# Patient Record
Sex: Male | Born: 1970 | Race: White | Hispanic: No | Smoking: Never smoker
Health system: Southern US, Community
[De-identification: ages and names within clinical notes are randomized; demographics above are authoritative.]

## PROBLEM LIST (undated history)

## (undated) DIAGNOSIS — E785 Hyperlipidemia, unspecified: Secondary | ICD-10-CM

## (undated) HISTORY — DX: Hyperlipidemia, unspecified: E78.5

## (undated) HISTORY — PX: VASECTOMY: SHX75

## (undated) HISTORY — PX: WISDOM TOOTH EXTRACTION: SHX21

## (undated) HISTORY — PX: CLEFT PALATE REPAIR: SUR1165

---

## 2004-04-24 ENCOUNTER — Ambulatory Visit: Payer: Self-pay | Admitting: Family Medicine

## 2005-04-11 ENCOUNTER — Ambulatory Visit: Payer: Self-pay | Admitting: Internal Medicine

## 2005-05-23 ENCOUNTER — Ambulatory Visit: Payer: Self-pay | Admitting: Family Medicine

## 2005-07-24 ENCOUNTER — Ambulatory Visit: Payer: Self-pay | Admitting: Family Medicine

## 2005-09-26 ENCOUNTER — Ambulatory Visit: Payer: Self-pay | Admitting: Family Medicine

## 2005-12-11 ENCOUNTER — Ambulatory Visit: Payer: Self-pay | Admitting: Family Medicine

## 2005-12-11 LAB — CONVERTED CEMR LAB
Chol/HDL Ratio, serum: 5.1
Triglyceride fasting, serum: 154 mg/dL — ABNORMAL HIGH (ref 0–149)
VLDL: 31 mg/dL (ref 0–40)

## 2006-10-15 ENCOUNTER — Encounter: Payer: Self-pay | Admitting: Family Medicine

## 2006-10-23 ENCOUNTER — Ambulatory Visit: Payer: Self-pay | Admitting: Family Medicine

## 2006-10-23 LAB — CONVERTED CEMR LAB
AST: 19 units/L (ref 0–37)
Bilirubin Urine: NEGATIVE
Bilirubin, Direct: 0.1 mg/dL (ref 0.0–0.3)
Chloride: 109 meq/L (ref 96–112)
Cholesterol: 171 mg/dL (ref 0–200)
Eosinophils Absolute: 0.2 10*3/uL (ref 0.0–0.6)
Eosinophils Relative: 4.8 % (ref 0.0–5.0)
GFR calc Af Amer: 108 mL/min
GFR calc non Af Amer: 89 mL/min
Glucose, Bld: 92 mg/dL (ref 70–99)
Glucose, Urine, Semiquant: NEGATIVE
HCT: 43.4 % (ref 39.0–52.0)
Hemoglobin: 14.9 g/dL (ref 13.0–17.0)
Lymphocytes Relative: 38.1 % (ref 12.0–46.0)
MCHC: 34.4 g/dL (ref 30.0–36.0)
MCV: 90.3 fL (ref 78.0–100.0)
Monocytes Absolute: 0.5 10*3/uL (ref 0.2–0.7)
Neutro Abs: 2.5 10*3/uL (ref 1.4–7.7)
Neutrophils Relative %: 46.5 % (ref 43.0–77.0)
Potassium: 4.1 meq/L (ref 3.5–5.1)
Sodium: 142 meq/L (ref 135–145)
Specific Gravity, Urine: 1.02
TSH: 2.64 microintl units/mL (ref 0.35–5.50)
WBC Urine, dipstick: NEGATIVE
WBC: 5.2 10*3/uL (ref 4.5–10.5)
pH: 6

## 2006-10-30 ENCOUNTER — Ambulatory Visit: Payer: Self-pay | Admitting: Family Medicine

## 2006-10-30 DIAGNOSIS — E785 Hyperlipidemia, unspecified: Secondary | ICD-10-CM | POA: Insufficient documentation

## 2007-10-29 ENCOUNTER — Ambulatory Visit: Payer: Self-pay | Admitting: Family Medicine

## 2007-10-29 LAB — CONVERTED CEMR LAB
Albumin: 3.9 g/dL (ref 3.5–5.2)
BUN: 11 mg/dL (ref 6–23)
Calcium: 9 mg/dL (ref 8.4–10.5)
Eosinophils Relative: 1.9 % (ref 0.0–5.0)
GFR calc Af Amer: 108 mL/min
Glucose, Bld: 93 mg/dL (ref 70–99)
Glucose, Urine, Semiquant: NEGATIVE
HCT: 43.9 % (ref 39.0–52.0)
Hemoglobin: 15.6 g/dL (ref 13.0–17.0)
Ketones, urine, test strip: NEGATIVE
Monocytes Absolute: 0.7 10*3/uL (ref 0.1–1.0)
Monocytes Relative: 9.2 % (ref 3.0–12.0)
Neutro Abs: 4.4 10*3/uL (ref 1.4–7.7)
Nitrite: NEGATIVE
Specific Gravity, Urine: 1.015
Total CHOL/HDL Ratio: 3.7
Total Protein: 7.2 g/dL (ref 6.0–8.3)
Triglycerides: 113 mg/dL (ref 0–149)
WBC Urine, dipstick: NEGATIVE
WBC: 7.1 10*3/uL (ref 4.5–10.5)

## 2007-11-07 ENCOUNTER — Ambulatory Visit: Payer: Self-pay | Admitting: Family Medicine

## 2007-11-07 DIAGNOSIS — M25519 Pain in unspecified shoulder: Secondary | ICD-10-CM | POA: Insufficient documentation

## 2007-11-18 ENCOUNTER — Ambulatory Visit: Payer: Self-pay | Admitting: Family Medicine

## 2007-11-19 LAB — CONVERTED CEMR LAB: PSA: 0.79 ng/mL (ref 0.10–4.00)

## 2009-04-28 ENCOUNTER — Ambulatory Visit: Payer: Self-pay | Admitting: Family Medicine

## 2009-04-28 LAB — CONVERTED CEMR LAB
ALT: 23 units/L (ref 0–53)
AST: 17 units/L (ref 0–37)
Albumin: 4.1 g/dL (ref 3.5–5.2)
Basophils Relative: 0.3 % (ref 0.0–3.0)
Eosinophils Relative: 2.3 % (ref 0.0–5.0)
GFR calc non Af Amer: 78.93 mL/min (ref >60)
Glucose, Bld: 92 mg/dL (ref 70–99)
Glucose, Urine, Semiquant: NEGATIVE
HCT: 45.3 % (ref 39.0–52.0)
Hemoglobin: 15.8 g/dL (ref 13.0–17.0)
Lymphs Abs: 2.1 10*3/uL (ref 0.7–4.0)
Monocytes Relative: 9.3 % (ref 3.0–12.0)
Neutro Abs: 3 10*3/uL (ref 1.4–7.7)
Potassium: 4.4 meq/L (ref 3.5–5.1)
RBC: 5.03 M/uL (ref 4.22–5.81)
RDW: 12.6 % (ref 11.5–14.6)
Sodium: 140 meq/L (ref 135–145)
Specific Gravity, Urine: 1.02
TSH: 2.37 microintl units/mL (ref 0.35–5.50)
Total Protein: 6.7 g/dL (ref 6.0–8.3)
VLDL: 32.4 mg/dL (ref 0.0–40.0)
WBC Urine, dipstick: NEGATIVE
WBC: 5.8 10*3/uL (ref 4.5–10.5)
pH: 6

## 2009-05-17 ENCOUNTER — Ambulatory Visit: Payer: Self-pay | Admitting: Family Medicine

## 2009-05-17 DIAGNOSIS — R0609 Other forms of dyspnea: Secondary | ICD-10-CM | POA: Insufficient documentation

## 2009-05-17 DIAGNOSIS — R0989 Other specified symptoms and signs involving the circulatory and respiratory systems: Secondary | ICD-10-CM | POA: Insufficient documentation

## 2009-06-02 ENCOUNTER — Ambulatory Visit: Payer: Self-pay | Admitting: Pulmonary Disease

## 2010-02-15 NOTE — Assessment & Plan Note (Signed)
Summary: consult for possible osa   Copy to:  Kelle Darting Primary Provider/Referring Provider:  Roderick Pee MD  CC:  Sleep Consult.  History of Present Illness: The pt is a 40y/o male who I have been asked to see for possible osa.  He has been snoring for years, but feels it is getting louder.  He has been noted to have pauses in his breathing during sleep, but denies choking arousals.  He has tried breathe-rite strips without improvement.  He typically goes to bed btw 11-12am, and arises at 7am to start his day.  He feels rested upon arising most days.  He works as an Pensions consultant, and denies any daytime sleepiness issues or abnormal alertness/concentration.  He does not doze with TV in the evening, and has no sleepiness with driving.  His weight is neutral over the last 2 years.  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  Medications Prior to Update: 1)  Zocor 80 Mg  Tabs (Simvastatin) .... Qhs 2)  Adult Aspirin Ec Low Strength 81 Mg Tbec (Aspirin) .... Once Daily  Allergies (verified): No Known Drug Allergies  Past History:  Past Medical History: Reviewed history from 10/30/2006 and no changes required. Hyperlipidemia surgery cleft lip and palate as a child  Past Surgical History: cleft palate and lip surgery at birth and also as a child wisdom teeth extracted  Family History: Reviewed history from 10/30/2006 and no changes required. allergies: mother,  heart disease: mother and father (hyperlipidemia)  cancer: father (prostate)  Family History High cholesterol  Social History: Reviewed history from 10/30/2006 and no changes required. Occupation: Clinical research associate Married with 2 daughters Never Smoked Alcohol use-yes Drug use-no Regular exercise-yes  Review of Systems       The patient complains of nasal congestion/difficulty breathing through nose.  The patient denies shortness of breath with activity, shortness of breath at rest,  productive cough, non-productive cough, coughing up blood, chest pain, irregular heartbeats, acid heartburn, indigestion, loss of appetite, weight change, abdominal pain, difficulty swallowing, sore throat, tooth/dental problems, headaches, sneezing, itching, ear ache, anxiety, depression, hand/feet swelling, joint stiffness or pain, rash, change in color of mucus, and fever.    Vital Signs:  Patient profile:   40 year old male Height:      72 inches Weight:      182 pounds BMI:     24.77 O2 Sat:      98 % on Room air Temp:     98.0 degrees F oral Pulse rate:   98 / minute BP sitting:   108 / 84  (right arm) Cuff size:   regular  Vitals Entered By: Arman Filter LPN (Jun 02, 2009 2:31 PM)  O2 Flow:  Room air CC: Sleep Consult Comments Medications reviewed with patient Arman Filter LPN  Jun 02, 2009 2:35 PM    Physical Exam  General:  thin male in nad Eyes:  PERRLA and EOMI.   Nose:  narrowed, but not obstructed Mouth:  mild narrowing posteriorly, but uvula and palate not overly abnormal Neck:  no jvd, tmg, LN Lungs:  totally clear to auscultation Heart:  rrr, no mrg Abdomen:  soft and nontender, bs+ Extremities:  no edema, pulses intact distally no cyanosis Neurologic:  alert and oriented, moves all 4.   Impression & Recommendations:  Problem # 1:  SNORING (ICD-786.09)  the pt's history is most suggestive of asymptomatic snoring.  His wife has witnessed a few  pauses, but he sleeps well and denies nonrestorative sleep.  He denies any concentration or alertness issues during the day with inactivity.  The pt is at an appropriate weight, and does not have a large neck size.  He does have a very narrow nasal airway, and some anatomical abnormalities probably related to prior surgeries (but no significant narrowing).  My suspicion for clinically signficant sleep apnea is very low.  I have told the pt that if his wife feels very strongly about ruling out this issue, can do  screening home study.  In terms of his snoring, I have outlined positional therapy, ENT evaluation with possible surgery, and dental appliance.  He will consider all of the above.  Other Orders: Consultation Level IV (14782)  Patient Instructions: 1)  let me know if worsening symptoms 2)  consider options for snoring, and let me know if I can be of assistance.

## 2010-02-15 NOTE — Assessment & Plan Note (Signed)
Summary: cpx//ccm   Vital Signs:  Patient profile:   40 year old male Height:      71 inches Weight:      182 pounds BMI:     25.48 Temp:     98.1 degrees F oral BP sitting:   110 / 78  (left arm) Cuff size:   regular  Vitals Entered By: Kern Reap CMA Duncan Dull) (May 17, 2009 3:54 PM) CC: cpx Is Patient Diabetic? No Pain Assessment Patient in pain? no        CC:  cpx.  History of Present Illness: Rudie is a 40 year old, married male, nonsmoker, who comes in today for physical evaluation because of underlying hyperlipidemia and a new problem of snoring and possible sleep apnea.  His hyperlipidemia is managed with Zocor 80 mg nightly also, one baby aspirin total cholesterol 167, LDL 94.  No side effects from medication.  For a long time now his wife has had issues with his snoring and she thinks he might have sleep apnea.  He did have surgery years ago for cleft lip and does have some narrowing of his upper airway.  Review of systems negative.  Tetanus booster 2008 seasonal flu 2010  Allergies: No Known Drug Allergies  Past History:  Past medical, surgical, family and social histories (including risk factors) reviewed, and no changes noted (except as noted below).  Past Medical History: Reviewed history from 10/30/2006 and no changes required. 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 Family History High cholesterol  Social History: Reviewed history from 10/30/2006 and no changes required. Occupation: Clinical research associate Married Never Smoked Alcohol use-yes Drug use-no Regular exercise-yes  Review of Systems      See HPI  Physical Exam  General:  Well-developed,well-nourished,in no acute distress; alert,appropriate and cooperative throughout examination Head:  Normocephalic and atraumatic without obvious abnormalities. No apparent alopecia or balding. Eyes:  No corneal or  conjunctival inflammation noted. EOMI. Perrla. Funduscopic exam benign, without hemorrhages, exudates or papilledema. Vision grossly normal. Ears:  External ear exam shows no significant lesions or deformities.  Otoscopic examination reveals clear canals, tympanic membranes are intact bilaterally without bulging, retraction, inflammation or discharge. Hearing is grossly normal bilaterally. Nose:  External nasal examination shows no deformity or inflammation. Nasal mucosa are pink and moist without lesions or exudates. Mouth:  oral cavity normal.  Slight deformity of upper lid from previous surgery as a child.  Also narrowing of the airway behind the tonsillar area. Neck:  No deformities, masses, or tenderness noted. Chest Wall:  No deformities, masses, tenderness or gynecomastia noted. Breasts:  No masses or gynecomastia noted Lungs:  Normal respiratory effort, chest expands symmetrically. Lungs are clear to auscultation, no crackles or wheezes. Heart:  Normal rate and regular rhythm. S1 and S2 normal without gallop, murmur, click, rub or other extra sounds. Abdomen:  Bowel sounds positive,abdomen soft and non-tender without masses, organomegaly or hernias noted. Genitalia:  Testes bilaterally descended without nodularity, tenderness or masses. No scrotal masses or lesions. No penis lesions or urethral discharge. Msk:  No deformity or scoliosis noted of thoracic or lumbar spine.   Pulses:  R and L carotid,radial,femoral,dorsalis pedis and posterior tibial pulses are full and equal bilaterally Extremities:  No clubbing, cyanosis, edema, or deformity noted with normal full range of motion of all joints.   Neurologic:  No cranial nerve deficits noted. Station and gait are normal. Plantar reflexes are down-going bilaterally.  DTRs are symmetrical throughout. Sensory, motor and coordinative functions appear intact. Skin:  Intact without suspicious lesions or rashes Cervical Nodes:  No lymphadenopathy  noted Axillary Nodes:  No palpable lymphadenopathy Inguinal Nodes:  No significant adenopathy Psych:  Cognition and judgment appear intact. Alert and cooperative with normal attention span and concentration. No apparent delusions, illusions, hallucinations   Impression & Recommendations:  Problem # 1:  PHYSICAL EXAMINATION (ICD-V70.0) Assessment Unchanged  Orders: EKG w/ Interpretation (93000)  Problem # 2:  HYPERLIPIDEMIA (ICD-272.4) Assessment: Improved  His updated medication list for this problem includes:    Zocor 80 Mg Tabs (Simvastatin) ..... Qhs  Orders: EKG w/ Interpretation (93000)  Problem # 3:  OTHER DYSPNEA AND RESPIRATORY ABNORMALITIES (ICD-786.09) Assessment: New  Orders: Pulmonary Referral (Pulmonary)  Complete Medication List: 1)  Zocor 80 Mg Tabs (Simvastatin) .... Qhs 2)  Adult Aspirin Ec Low Strength 81 Mg Tbec (Aspirin) .... Once daily  Patient Instructions: 1)  I will put in a call to [facility] who does the sleep apnea evaluation.  2)  Please schedule a follow-up appointment in 1 year. Prescriptions: ZOCOR 80 MG  TABS (SIMVASTATIN) QHS  #100 x 3   Entered and Authorized by:   Roderick Pee MD   Signed by:   Roderick Pee MD on 05/17/2009   Method used:   Print then Give to Patient   RxID:   [id number]

## 2010-04-25 ENCOUNTER — Other Ambulatory Visit: Payer: Self-pay | Admitting: Family Medicine

## 2010-10-22 ENCOUNTER — Other Ambulatory Visit: Payer: Self-pay | Admitting: Family Medicine

## 2010-12-18 ENCOUNTER — Ambulatory Visit (INDEPENDENT_AMBULATORY_CARE_PROVIDER_SITE_OTHER): Payer: Self-pay | Admitting: Family Medicine

## 2010-12-18 ENCOUNTER — Encounter: Payer: Self-pay | Admitting: Family Medicine

## 2010-12-18 VITALS — BP 110/78 | Temp 98.2°F | Ht 77.0 in | Wt 192.0 lb

## 2010-12-18 DIAGNOSIS — Z0000 Encounter for general adult medical examination without abnormal findings: Secondary | ICD-10-CM

## 2010-12-18 DIAGNOSIS — H900 Conductive hearing loss, bilateral: Secondary | ICD-10-CM | POA: Insufficient documentation

## 2010-12-18 NOTE — Patient Instructions (Signed)
Return prn 

## 2010-12-18 NOTE — Progress Notes (Signed)
  Subjective:    Patient ID: Dylan Kline, male    DOB: 03-03-1970, 40 y.o.   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 Who comes in today for evaluation of bilateral hearing loss.  Attempt and gradually over the past couple months.  No history of trauma.  Has had a history of cerumen impactions   Review of Systems    General ENT review of systems otherwise negative Objective:   Physical Exam  Well-developed well-nourished man in no acute distress.  Examination of both ears shows bilateral cerumen impactions.  The right ear was flushed with warm water.  The left ear was suctioned by me      Assessment & Plan:  Bilateral hearing loss, secondary to her cerumen impactions cerumen impactions removed

## 2011-01-21 ENCOUNTER — Other Ambulatory Visit: Payer: Self-pay | Admitting: Family Medicine

## 2011-02-02 ENCOUNTER — Other Ambulatory Visit: Payer: Self-pay

## 2011-02-22 ENCOUNTER — Encounter: Payer: Self-pay | Admitting: Family Medicine

## 2011-04-05 ENCOUNTER — Other Ambulatory Visit (INDEPENDENT_AMBULATORY_CARE_PROVIDER_SITE_OTHER): Payer: BC Managed Care – PPO

## 2011-04-05 DIAGNOSIS — Z0000 Encounter for general adult medical examination without abnormal findings: Secondary | ICD-10-CM

## 2011-04-05 LAB — CBC WITH DIFFERENTIAL/PLATELET
Basophils Absolute: 0 10*3/uL (ref 0.0–0.1)
Eosinophils Absolute: 0.2 10*3/uL (ref 0.0–0.7)
Hemoglobin: 15.2 g/dL (ref 13.0–17.0)
Lymphocytes Relative: 37.8 % (ref 12.0–46.0)
Monocytes Relative: 8.9 % (ref 3.0–12.0)
Neutro Abs: 2.6 10*3/uL (ref 1.4–7.7)
Neutrophils Relative %: 49.2 % (ref 43.0–77.0)
RBC: 4.94 Mil/uL (ref 4.22–5.81)
RDW: 12.3 % (ref 11.5–14.6)

## 2011-04-05 LAB — BASIC METABOLIC PANEL
Calcium: 8.6 mg/dL (ref 8.4–10.5)
Creatinine, Ser: 1.1 mg/dL (ref 0.4–1.5)
GFR: 82.48 mL/min (ref >60.00)
Glucose, Bld: 74 mg/dL (ref 70–99)
Sodium: 136 mEq/L (ref 135–145)

## 2011-04-05 LAB — LIPID PANEL
Cholesterol: 174 mg/dL (ref 0–200)
LDL Cholesterol: 105 mg/dL — ABNORMAL HIGH (ref 0–99)
Total CHOL/HDL Ratio: 4

## 2011-04-05 LAB — HEPATIC FUNCTION PANEL
ALT: 22 U/L (ref 0–53)
AST: 17 U/L (ref 0–37)
Bilirubin, Direct: 0 mg/dL (ref 0.0–0.3)
Total Bilirubin: 0.9 mg/dL (ref 0.3–1.2)

## 2011-04-05 LAB — POCT URINALYSIS DIPSTICK
Leukocytes, UA: NEGATIVE
Protein, UA: NEGATIVE
Urobilinogen, UA: 0.2

## 2011-04-12 ENCOUNTER — Encounter: Payer: Self-pay | Admitting: Family Medicine

## 2011-04-12 ENCOUNTER — Ambulatory Visit (INDEPENDENT_AMBULATORY_CARE_PROVIDER_SITE_OTHER): Payer: BC Managed Care – PPO | Admitting: Family Medicine

## 2011-04-12 VITALS — BP 110/78 | Temp 97.9°F | Ht 71.25 in | Wt 185.0 lb

## 2011-04-12 DIAGNOSIS — H900 Conductive hearing loss, bilateral: Secondary | ICD-10-CM

## 2011-04-12 DIAGNOSIS — M25519 Pain in unspecified shoulder: Secondary | ICD-10-CM

## 2011-04-12 DIAGNOSIS — E785 Hyperlipidemia, unspecified: Secondary | ICD-10-CM

## 2011-04-12 DIAGNOSIS — Z0000 Encounter for general adult medical examination without abnormal findings: Secondary | ICD-10-CM | POA: Insufficient documentation

## 2011-04-12 MED ORDER — ATORVASTATIN CALCIUM 40 MG PO TABS: 40.0000 mg | ORAL_TABLET | Freq: Every day | ORAL | Status: DC

## 2011-04-12 NOTE — Patient Instructions (Signed)
Continue your good health habits return in one year sooner if any problems 

## 2011-04-12 NOTE — Progress Notes (Signed)
  Subjective:    Patient ID: Dylan Kline, male    DOB: August 06, 1970, 41 y.o.   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 At one time he saw Dr. Rayburn Ma the orthopedist for his shortness in his right shoulder. He is right-handed and used to play softball a lot. Tetanus 2008  Review of systems negative  Family history unchanged father developed prostate cancer when he was in his late 36s. We discussed the concept of prostate cancer screening pros and cons and based on his family history I would recommend we not start any PSA screening until age 68 if he is asymptomatic and he has a normal exam   Review of Systems  Constitutional: Negative.   HENT: Negative.   Eyes: Negative.   Respiratory: Negative.   Cardiovascular: Negative.   Gastrointestinal: Negative.   Genitourinary: Negative.   Musculoskeletal: Negative.   Skin: Negative.   Neurological: Negative.   Hematological: Negative.   Psychiatric/Behavioral: Negative.        Objective:   Physical Exam  Constitutional: He is oriented to person, place, and time. He appears well-developed and well-nourished.  HENT:  Head: Normocephalic and atraumatic.  Right Ear: External ear normal.  Left Ear: External ear normal.  Nose: Nose normal.  Mouth/Throat: Oropharynx is clear and moist.  Eyes: Conjunctivae and EOM are normal. Pupils are equal, round, and reactive to light.  Neck: Normal range of motion. Neck supple. No JVD present. No tracheal deviation present. No thyromegaly present.  Cardiovascular: Normal rate, regular rhythm, normal heart sounds and intact distal pulses.  Exam reveals no gallop and no friction rub.   No murmur heard. Pulmonary/Chest: Effort normal and breath sounds normal. No stridor. No respiratory distress. He has no wheezes. He  has no rales. He exhibits no tenderness.  Abdominal: Soft. Bowel sounds are normal. He exhibits no distension and no mass. There is no tenderness. There is no rebound and no guarding.  Genitourinary: Rectum normal, prostate normal and penis normal. Guaiac negative stool. No penile tenderness.  Musculoskeletal: Normal range of motion. He exhibits no edema and no tenderness.  Lymphadenopathy:    He has no cervical adenopathy.  Neurological: He is alert and oriented to person, place, and time. He has normal reflexes. No cranial nerve deficit. He exhibits normal muscle tone.  Skin: Skin is warm and dry. No rash noted. No erythema. No pallor.  Psychiatric: He has a normal mood and affect. His behavior is normal. Judgment and thought content normal.          Assessment & Plan:  Healthy male  Rotator cuff injury right shoulder recommend Motrin 600 mg twice a day range of motion exercises  History of cleft palate repair as a child

## 2011-05-05 ENCOUNTER — Other Ambulatory Visit: Payer: Self-pay | Admitting: Family Medicine

## 2012-06-23 ENCOUNTER — Encounter: Payer: Self-pay | Admitting: Family Medicine

## 2012-06-23 ENCOUNTER — Ambulatory Visit (INDEPENDENT_AMBULATORY_CARE_PROVIDER_SITE_OTHER): Payer: BC Managed Care – PPO | Admitting: Family Medicine

## 2012-06-23 ENCOUNTER — Encounter: Payer: BC Managed Care – PPO | Admitting: Family Medicine

## 2012-06-23 ENCOUNTER — Telehealth: Payer: Self-pay | Admitting: Family Medicine

## 2012-06-23 VITALS — BP 110/80

## 2012-06-23 DIAGNOSIS — L255 Unspecified contact dermatitis due to plants, except food: Secondary | ICD-10-CM

## 2012-06-23 DIAGNOSIS — L247 Irritant contact dermatitis due to plants, except food: Secondary | ICD-10-CM

## 2012-06-23 MED ORDER — PREDNISONE 20 MG PO TABS: ORAL_TABLET | ORAL | Status: DC

## 2012-06-23 NOTE — Progress Notes (Signed)
  Subjective:    Patient ID: Dylan Kline, male    DOB: Mar 02, 1970, 42 y.o.   MRN: [id number]  HPI Dylan Kline is a 42 year old male who comes in today for evaluation of pain in his scalp  He noticed the other day some discomfort in the scalp now spread to the top of the scalp. He describes it as painful  No history of previous symptoms  He has been doing a lot of yard work   Review of Systems Review of systems otherwise negative     Objective:   Physical Exam  Well-developed and nourished male no acute distress examination scalp shows vesicular lesions and red raised areas consistent with a contact type dermatitis      Assessment & Plan:  Contact dermatitis of the scalp plan prednisone burst and taper

## 2012-06-23 NOTE — Patient Instructions (Signed)
Take the prednisone as directed,,,,,,,,, 2 now then starting tomorrow morning 2 tabs daily for 3 days and then taper  Return when necessary

## 2012-06-23 NOTE — Telephone Encounter (Signed)
Pt has painful rash/allergic reaction on on his scalp. Very painful. Pt request today appt. Another provider if Dr Tawanna Cooler not here. Pls advise.

## 2012-06-23 NOTE — Telephone Encounter (Signed)
Patient coming in today  

## 2012-06-23 NOTE — Progress Notes (Signed)
Error   This encounter was created in error - please disregard. 

## 2013-03-27 ENCOUNTER — Telehealth: Payer: Self-pay | Admitting: Family Medicine

## 2013-03-27 NOTE — Telephone Encounter (Signed)
Pt needs refill of simvastatin (ZOCOR) 80 MG tablet Pt has made cpe appt for 05/28/13. Can pt get enough to get through until a [facility]/ [address] [address]

## 2013-03-30 MED ORDER — SIMVASTATIN 80 MG PO TABS: ORAL_TABLET | ORAL | Status: DC

## 2013-03-30 NOTE — Telephone Encounter (Signed)
Pt notified Rx sent to pharmacy

## 2013-05-21 ENCOUNTER — Other Ambulatory Visit (INDEPENDENT_AMBULATORY_CARE_PROVIDER_SITE_OTHER): Payer: BC Managed Care – PPO

## 2013-05-21 DIAGNOSIS — Z0000 Encounter for general adult medical examination without abnormal findings: Secondary | ICD-10-CM

## 2013-05-21 LAB — BASIC METABOLIC PANEL
BUN: 15 mg/dL (ref 6–23)
CHLORIDE: 106 meq/L (ref 96–112)
CO2: 28 meq/L (ref 19–32)
CREATININE: 0.9 mg/dL (ref 0.4–1.5)
Calcium: 8.6 mg/dL (ref 8.4–10.5)
GFR: 92.78 mL/min (ref >60.00)
GLUCOSE: 84 mg/dL (ref 70–99)
POTASSIUM: 4.2 meq/L (ref 3.5–5.1)
Sodium: 139 mEq/L (ref 135–145)

## 2013-05-21 LAB — CBC WITH DIFFERENTIAL/PLATELET
Basophils Absolute: 0 10*3/uL (ref 0.0–0.1)
Basophils Relative: 0.6 % (ref 0.0–3.0)
EOS ABS: 0.1 10*3/uL (ref 0.0–0.7)
Eosinophils Relative: 2.9 % (ref 0.0–5.0)
HEMATOCRIT: 43.7 % (ref 39.0–52.0)
Hemoglobin: 15.1 g/dL (ref 13.0–17.0)
LYMPHS ABS: 2.1 10*3/uL (ref 0.7–4.0)
Lymphocytes Relative: 40.4 % (ref 12.0–46.0)
MCHC: 34.5 g/dL (ref 30.0–36.0)
MCV: 90.4 fl (ref 78.0–100.0)
MONO ABS: 0.4 10*3/uL (ref 0.1–1.0)
Monocytes Relative: 8 % (ref 3.0–12.0)
Neutro Abs: 2.5 10*3/uL (ref 1.4–7.7)
Neutrophils Relative %: 48.1 % (ref 43.0–77.0)
Platelets: 166 10*3/uL (ref 150.0–400.0)
RBC: 4.84 Mil/uL (ref 4.22–5.81)
RDW: 12.5 % (ref 11.5–15.5)
WBC: 5.1 10*3/uL (ref 4.0–10.5)

## 2013-05-21 LAB — TSH: TSH: 0.87 u[IU]/mL (ref 0.35–4.50)

## 2013-05-21 LAB — HEPATIC FUNCTION PANEL
ALT: 28 U/L (ref 0–53)
AST: 18 U/L (ref 0–37)
Albumin: 3.9 g/dL (ref 3.5–5.2)
Alkaline Phosphatase: 37 U/L — ABNORMAL LOW (ref 39–117)
BILIRUBIN TOTAL: 0.9 mg/dL (ref 0.2–1.2)
Bilirubin, Direct: 0 mg/dL (ref 0.0–0.3)
Total Protein: 6.6 g/dL (ref 6.0–8.3)

## 2013-05-21 LAB — POCT URINALYSIS DIPSTICK
Bilirubin, UA: NEGATIVE
GLUCOSE UA: NEGATIVE
Ketones, UA: NEGATIVE
Leukocytes, UA: NEGATIVE
Nitrite, UA: NEGATIVE
Protein, UA: NEGATIVE
RBC UA: NEGATIVE
Spec Grav, UA: 1.01
Urobilinogen, UA: 0.2
pH, UA: 6.5

## 2013-05-21 LAB — LIPID PANEL
CHOLESTEROL: 198 mg/dL (ref 0–200)
HDL: 44.2 mg/dL (ref >39.00)
LDL Cholesterol: 124 mg/dL — ABNORMAL HIGH (ref 0–99)
Total CHOL/HDL Ratio: 4
Triglycerides: 149 mg/dL (ref 0.0–149.0)
VLDL: 29.8 mg/dL (ref 0.0–40.0)

## 2013-05-28 ENCOUNTER — Ambulatory Visit (INDEPENDENT_AMBULATORY_CARE_PROVIDER_SITE_OTHER): Payer: BC Managed Care – PPO | Admitting: Family Medicine

## 2013-05-28 ENCOUNTER — Encounter: Payer: Self-pay | Admitting: Family Medicine

## 2013-05-28 VITALS — BP 120/84 | Temp 98.2°F | Ht 71.0 in | Wt 188.0 lb

## 2013-05-28 DIAGNOSIS — E785 Hyperlipidemia, unspecified: Secondary | ICD-10-CM

## 2013-05-28 DIAGNOSIS — Z0000 Encounter for general adult medical examination without abnormal findings: Secondary | ICD-10-CM

## 2013-05-28 DIAGNOSIS — Z8042 Family history of malignant neoplasm of prostate: Secondary | ICD-10-CM | POA: Insufficient documentation

## 2013-05-28 LAB — PSA: PSA: 0.75 ng/mL (ref 0.10–4.00)

## 2013-05-28 MED ORDER — SIMVASTATIN 80 MG PO TABS: ORAL_TABLET | ORAL | Status: DC

## 2013-05-28 NOTE — Progress Notes (Signed)
Pre visit review using our clinic review tool, if applicable. No additional management support is needed unless otherwise documented below in the visit note. 

## 2013-05-28 NOTE — Progress Notes (Signed)
   Subjective:    Patient ID: Dylan Kline, male    DOB: February 16, 1970, 43 y.o.   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 He is having some difficulty with his vision at night. Referred to Dr. Bing Plume  He gets regular dental care. Colonoscopy at 58 no family history colon cancer or polyps  Vaccinations up-to-date about Apolonio Schneiders  Father had prostate cancer in his late 56s   Review of Systems  Constitutional: Negative.   HENT: Negative.   Eyes: Negative.   Respiratory: Negative.   Cardiovascular: Negative.   Gastrointestinal: Negative.   Genitourinary: Negative.   Musculoskeletal: Negative.   Skin: Negative.   Neurological: Negative.   Psychiatric/Behavioral: Negative.        Objective:   Physical Exam  Nursing note and vitals reviewed. Constitutional: He is oriented to person, place, and time. He appears well-developed and well-nourished.  HENT:  Head: Normocephalic and atraumatic.  Right Ear: External ear normal.  Left Ear: External ear normal.  Nose: Nose normal.  Mouth/Throat: Oropharynx is clear and moist.  Eyes: Conjunctivae and EOM are normal. Pupils are equal, round, and reactive to light.  Neck: Normal range of motion. Neck supple. No JVD present. No tracheal deviation present. No thyromegaly present.  Cardiovascular: Normal rate, regular rhythm, normal heart sounds and intact distal pulses.  Exam reveals no gallop and no friction rub.   No murmur heard. Pulmonary/Chest: Effort normal and breath sounds normal. No stridor. No respiratory distress. He has no wheezes. He has no rales. He exhibits no tenderness.  Abdominal: Soft. Bowel sounds are normal. He exhibits no distension and no mass. There is no tenderness. There  is no rebound and no guarding.  Genitourinary: Rectum normal, prostate normal and penis normal. Guaiac negative stool. No penile tenderness.  Musculoskeletal: Normal range of motion. He exhibits no edema and no tenderness.  Lymphadenopathy:    He has no cervical adenopathy.  Neurological: He is alert and oriented to person, place, and time. He has normal reflexes. No cranial nerve deficit. He exhibits normal muscle tone.  Skin: Skin is warm and dry. No rash noted. No erythema. No pallor.  Psychiatric: He has a normal mood and affect. His behavior is normal. Judgment and thought content normal.   well healed surgery from cleft palate        Assessment & Plan:  Healthy male  Hyperlipidemia continue Lipitor  Family history prostate cancer check PSA

## 2013-05-28 NOTE — Patient Instructions (Signed)
Continue current medications  Followup in one year for general physical examination  PSA today

## 2014-03-11 ENCOUNTER — Ambulatory Visit (INDEPENDENT_AMBULATORY_CARE_PROVIDER_SITE_OTHER): Payer: BLUE CROSS/BLUE SHIELD | Admitting: Family Medicine

## 2014-03-11 VITALS — BP 120/80 | Temp 98.2°F | Wt 183.0 lb

## 2014-03-11 DIAGNOSIS — Z8042 Family history of malignant neoplasm of prostate: Secondary | ICD-10-CM

## 2014-03-11 DIAGNOSIS — J302 Other seasonal allergic rhinitis: Secondary | ICD-10-CM

## 2014-03-11 DIAGNOSIS — J309 Allergic rhinitis, unspecified: Secondary | ICD-10-CM | POA: Insufficient documentation

## 2014-03-11 NOTE — Progress Notes (Signed)
   Subjective:    Patient ID: Dylan Kline, male    DOB: [birth year], 44 y.o.   MRN: [id number]  HPI Dylan Kline is a 43 year old married male nonsmoker who comes in today for evaluation of a cough  He states he's had a cough intermittently off and on for many years. No fever no sputum production. He's a nonsmoker.  Environmentally they do have a cat   Review of Systems Review of systems otherwise negative    Objective:   Physical Exam  Well-developed well-nourished male no acute distress vital signs stable he is afebrile HEENT pertinent the has a scar in the posterior oropharynx from previous surgery had a cleft palate and cleft lip.  A lot of postnasal drip  Neck was normal lungs are clear      Assessment & Plan:  Allergic rhinitis........... discussed environmental triggers .......Marland Kitchen Zyrtec and/or steroid nasal spray daily at bedtime when necessary .....Marland KitchenMarland Kitchen

## 2014-03-11 NOTE — Progress Notes (Signed)
Pre visit review using our clinic review tool, if applicable. No additional management support is needed unless otherwise documented below in the visit note. 

## 2014-03-11 NOTE — Patient Instructions (Addendum)
Zyrtec plain......... one daily at bedtime  Steroid nasal spray........Marland Kitchen 1 shot up each nostril at bedtime if the Zyrtec alone does not control your symptoms  Return when necessary  Set up an appointment mid-May for your annual exam

## 2014-05-27 ENCOUNTER — Other Ambulatory Visit (INDEPENDENT_AMBULATORY_CARE_PROVIDER_SITE_OTHER): Payer: BLUE CROSS/BLUE SHIELD

## 2014-05-27 DIAGNOSIS — J302 Other seasonal allergic rhinitis: Secondary | ICD-10-CM | POA: Diagnosis not present

## 2014-05-27 DIAGNOSIS — Z8042 Family history of malignant neoplasm of prostate: Secondary | ICD-10-CM | POA: Diagnosis not present

## 2014-05-27 LAB — BASIC METABOLIC PANEL
BUN: 13 mg/dL (ref 6–23)
CALCIUM: 8.6 mg/dL (ref 8.4–10.5)
CHLORIDE: 106 meq/L (ref 96–112)
CO2: 28 mEq/L (ref 19–32)
CREATININE: 0.95 mg/dL (ref 0.40–1.50)
GFR: 91.22 mL/min (ref >60.00)
Glucose, Bld: 86 mg/dL (ref 70–99)
Potassium: 4.3 mEq/L (ref 3.5–5.1)
Sodium: 139 mEq/L (ref 135–145)

## 2014-05-27 LAB — HEPATIC FUNCTION PANEL
ALT: 25 U/L (ref 0–53)
AST: 15 U/L (ref 0–37)
Albumin: 3.8 g/dL (ref 3.5–5.2)
Alkaline Phosphatase: 41 U/L (ref 39–117)
Bilirubin, Direct: 0.1 mg/dL (ref 0.0–0.3)
TOTAL PROTEIN: 6.3 g/dL (ref 6.0–8.3)
Total Bilirubin: 0.4 mg/dL (ref 0.2–1.2)

## 2014-05-27 LAB — CBC WITH DIFFERENTIAL/PLATELET
BASOS ABS: 0 10*3/uL (ref 0.0–0.1)
Basophils Relative: 0.6 % (ref 0.0–3.0)
Eosinophils Absolute: 0.1 10*3/uL (ref 0.0–0.7)
Eosinophils Relative: 3.1 % (ref 0.0–5.0)
HCT: 42.8 % (ref 39.0–52.0)
HEMOGLOBIN: 14.9 g/dL (ref 13.0–17.0)
LYMPHS ABS: 1.9 10*3/uL (ref 0.7–4.0)
Lymphocytes Relative: 40.1 % (ref 12.0–46.0)
MCHC: 34.9 g/dL (ref 30.0–36.0)
MCV: 87.6 fl (ref 78.0–100.0)
MONO ABS: 0.5 10*3/uL (ref 0.1–1.0)
Monocytes Relative: 10.1 % (ref 3.0–12.0)
Neutro Abs: 2.2 10*3/uL (ref 1.4–7.7)
Neutrophils Relative %: 46.1 % (ref 43.0–77.0)
PLATELETS: 157 10*3/uL (ref 150.0–400.0)
RBC: 4.88 Mil/uL (ref 4.22–5.81)
RDW: 13 % (ref 11.5–15.5)
WBC: 4.8 10*3/uL (ref 4.0–10.5)

## 2014-05-27 LAB — POCT URINALYSIS DIPSTICK
Bilirubin, UA: NEGATIVE
Glucose, UA: NEGATIVE
Ketones, UA: NEGATIVE
Leukocytes, UA: NEGATIVE
Nitrite, UA: NEGATIVE
PROTEIN UA: NEGATIVE
RBC UA: NEGATIVE
SPEC GRAV UA: 1.02
UROBILINOGEN UA: 0.2
pH, UA: 6

## 2014-05-27 LAB — LIPID PANEL
Cholesterol: 173 mg/dL (ref 0–200)
HDL: 43.3 mg/dL (ref >39.00)
LDL Cholesterol: 106 mg/dL — ABNORMAL HIGH (ref 0–99)
NonHDL: 129.7
Total CHOL/HDL Ratio: 4
Triglycerides: 118 mg/dL (ref 0.0–149.0)
VLDL: 23.6 mg/dL (ref 0.0–40.0)

## 2014-05-27 LAB — PSA: PSA: 0.7 ng/mL (ref 0.10–4.00)

## 2014-05-27 LAB — TSH: TSH: 2.12 u[IU]/mL (ref 0.35–4.50)

## 2014-06-03 ENCOUNTER — Ambulatory Visit (INDEPENDENT_AMBULATORY_CARE_PROVIDER_SITE_OTHER): Payer: BLUE CROSS/BLUE SHIELD | Admitting: Family Medicine

## 2014-06-03 ENCOUNTER — Encounter: Payer: Self-pay | Admitting: Family Medicine

## 2014-06-03 VITALS — BP 120/84 | Temp 98.7°F | Ht 72.5 in | Wt 187.0 lb

## 2014-06-03 DIAGNOSIS — Z0000 Encounter for general adult medical examination without abnormal findings: Secondary | ICD-10-CM | POA: Diagnosis not present

## 2014-06-03 DIAGNOSIS — Z8042 Family history of malignant neoplasm of prostate: Secondary | ICD-10-CM

## 2014-06-03 DIAGNOSIS — E785 Hyperlipidemia, unspecified: Secondary | ICD-10-CM

## 2014-06-03 NOTE — Progress Notes (Signed)
Pre visit review using our clinic review tool, if applicable. No additional management support is needed unless otherwise documented below in the visit note. 

## 2014-06-03 NOTE — Progress Notes (Signed)
   Subjective:    Patient ID: Dylan Kline, male    DOB: 05/14/1970, 44 y.o.   MRN: [id number]  HPI Dylan Kline is a delightful 44 year old married male nonsmoker who comes in today for general physical examination because of a history of prostate cancer  Does not get routine eye care no history of any eye disease. Recommend screening eye exam every 3 years before age 65  Does get regular dental care walks sporadically.  Social history he is married lives here in [place] is a seventh grader and a ninth grader both girls at the [employer]. 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 Colonoscopy deferred a 49 no family history of colon cancer.   Review of Systems  Constitutional: Negative.   HENT: Negative.   Eyes: Negative.   Respiratory: Negative.   Cardiovascular: Negative.   Gastrointestinal: Negative.   Endocrine: Negative.   Genitourinary: Negative.   Musculoskeletal: Negative.   Skin: Negative.   Allergic/Immunologic: Negative.   Neurological: Negative.   Hematological: Negative.   Psychiatric/Behavioral: Negative.        Objective:   Physical Exam  Constitutional: He is oriented to person, place, and time. He appears well-developed and well-nourished.  HENT:  Head: Normocephalic and atraumatic.  Right Ear: External ear normal.  Left Ear: External ear normal.  Nose: Nose normal.  Mouth/Throat: Oropharynx is clear and moist.  Eyes: Conjunctivae and EOM are normal. Pupils are equal, round, and reactive to light.  Neck: Normal range of motion. Neck supple. No JVD present. No tracheal deviation present. No thyromegaly present.  Cardiovascular: Normal rate, regular rhythm, normal heart sounds and intact distal pulses.  Exam reveals no gallop and no friction rub.   No murmur heard. Pulmonary/Chest: Effort normal and breath sounds normal. No stridor. No respiratory distress. He has no wheezes. He has  no rales. He exhibits no tenderness.  Abdominal: Soft. Bowel sounds are normal. He exhibits no distension and no mass. There is no tenderness. There is no rebound and no guarding.  Genitourinary: Rectum normal, prostate normal and penis normal. Guaiac negative stool. No penile tenderness.  Musculoskeletal: Normal range of motion. He exhibits no edema or tenderness.  Lymphadenopathy:    He has no cervical adenopathy.  Neurological: He is alert and oriented to person, place, and time. He has normal reflexes. No cranial nerve deficit. He exhibits normal muscle tone.  Skin: Skin is warm and dry. No rash noted. No erythema. No pallor.  Psychiatric: He has a normal mood and affect. His behavior is normal. Judgment and thought content normal.  Nursing note and vitals reviewed.  Scars in the posterior pharynx from surgery related to cleft lip and palate       Assessment & Plan:  Healthy male  Family history of prostate cancer....... normal physical exam and PSA

## 2014-06-03 NOTE — Patient Instructions (Signed)
Walk 30 minutes daily  Return in one year for general physical exam sooner if any problems  Dr. Remonia Richter or Dr. Tora Duck

## 2015-04-18 DIAGNOSIS — Z3009 Encounter for other general counseling and advice on contraception: Secondary | ICD-10-CM | POA: Diagnosis not present

## 2015-06-17 DIAGNOSIS — Z302 Encounter for sterilization: Secondary | ICD-10-CM | POA: Diagnosis not present

## 2015-10-18 DIAGNOSIS — Z23 Encounter for immunization: Secondary | ICD-10-CM | POA: Diagnosis not present

## 2015-11-07 ENCOUNTER — Encounter (INDEPENDENT_AMBULATORY_CARE_PROVIDER_SITE_OTHER): Payer: Self-pay | Admitting: Orthopaedic Surgery

## 2015-11-07 ENCOUNTER — Other Ambulatory Visit (INDEPENDENT_AMBULATORY_CARE_PROVIDER_SITE_OTHER): Payer: Self-pay | Admitting: Orthopaedic Surgery

## 2015-11-07 ENCOUNTER — Ambulatory Visit (HOSPITAL_BASED_OUTPATIENT_CLINIC_OR_DEPARTMENT_OTHER)
Admission: RE | Admit: 2015-11-07 | Discharge: 2015-11-07 | Disposition: A | Discharge status: Home / Self Care | Payer: BLUE CROSS/BLUE SHIELD | Source: Ambulatory Visit | Attending: Orthopaedic Surgery | Admitting: Orthopaedic Surgery

## 2015-11-07 ENCOUNTER — Ambulatory Visit (INDEPENDENT_AMBULATORY_CARE_PROVIDER_SITE_OTHER): Payer: BLUE CROSS/BLUE SHIELD | Admitting: Orthopaedic Surgery

## 2015-11-07 DIAGNOSIS — M25532 Pain in left wrist: Secondary | ICD-10-CM | POA: Insufficient documentation

## 2015-11-07 NOTE — Progress Notes (Signed)
   Office Visit Note   Patient: Dylan Kline           Date of Birth: [birth year]           MRN: [id number] Visit Date: 11/07/2015              Requested by: Dorena Cookey, MD [place], [place] [id number] PCP: Joycelyn Man, MD   Assessment & Plan: Visit Diagnoses:  1. Pain in left wrist     Plan: NSAIDS, has own wrist splint, rest, follw-up prn  Follow-Up Instructions: Return if symptoms worsen or fail to improve.   Orders:  No orders of the defined types were placed in this encounter.  No orders of the defined types were placed in this encounter.     Procedures: No procedures performed   Clinical Data: No additional findings.   Subjective: Chief Complaint  Patient presents with  . Left Wrist - Injury    Pain from lifting luggage yesterday, now having painful radiation. Wearing velcro splint today. Denies any swelling.    HPI  Review of Systems   Objective: Vital Signs: There were no vitals taken for this visit.  Physical Exam  Constitutional: He is oriented to person, place, and time. He appears well-developed and well-nourished.  Musculoskeletal:       Left wrist: He exhibits tenderness.  Neurological: He is alert and oriented to person, place, and time.   Tenderness over the ECU tendon; no subluxating of the tendon.   Ortho Exam  Specialty Comments:  No specialty comments available.  Imaging: Dg Wrist Complete Left  Result Date: 11/07/2015 CLINICAL DATA:  Patient hurt his left wrist getting a heavy suitcase out of a SUV yesterday; pain is lateral left wrist/distal lateral forearm EXAM: LEFT WRIST - COMPLETE 3+ VIEW COMPARISON:  None. FINDINGS: There is no evidence of fracture or dislocation. There is no evidence of arthropathy or other focal bone abnormality. Soft tissues are unremarkable. IMPRESSION: Negative. Electronically Signed   By: Lucrezia Europe M.D.   On: 11/07/2015 09:12     PMFS History: Patient Active Problem  List   Diagnosis Date Noted  . Allergic rhinitis 03/11/2014  . Family history of prostate cancer 05/28/2013  . Contact dermatitis and eczema due to plant 06/23/2012  . Routine general medical examination at a health care facility 04/12/2011  . Hearing loss, conductive, bilateral 12/18/2010  . Other dyspnea and respiratory abnormality 05/17/2009  . SHOULDER PAIN, RIGHT 11/07/2007  . Hyperlipidemia 10/30/2006   Past Medical History:  Diagnosis Date  . Hyperlipidemia     Family History  Problem Relation Age of Onset  . Allergies Mother   . Hyperlipidemia Mother   . Heart disease Mother   . Hyperlipidemia Father   . Cancer Father     prostate    Past Surgical History:  Procedure Laterality Date  . CLEFT PALATE REPAIR    . WISDOM TOOTH EXTRACTION     Social History   Occupational History  . Not on file.   Social History Main Topics  . Smoking status: Never Smoker  . Smokeless tobacco: Never Used  . Alcohol use Yes  . Drug use: Unknown  . Sexual activity: Not on file

## 2016-09-30 DIAGNOSIS — Z23 Encounter for immunization: Secondary | ICD-10-CM | POA: Diagnosis not present

## 2016-12-03 ENCOUNTER — Ambulatory Visit (INDEPENDENT_AMBULATORY_CARE_PROVIDER_SITE_OTHER): Payer: BLUE CROSS/BLUE SHIELD | Admitting: Family Medicine

## 2016-12-03 ENCOUNTER — Encounter: Payer: Self-pay | Admitting: Family Medicine

## 2016-12-03 VITALS — BP 120/78 | HR 86 | Temp 98.4°F | Ht 72.0 in | Wt 189.0 lb

## 2016-12-03 DIAGNOSIS — Z8042 Family history of malignant neoplasm of prostate: Secondary | ICD-10-CM

## 2016-12-03 DIAGNOSIS — Z0000 Encounter for general adult medical examination without abnormal findings: Secondary | ICD-10-CM

## 2016-12-03 DIAGNOSIS — E7841 Elevated Lipoprotein(a): Secondary | ICD-10-CM

## 2016-12-03 DIAGNOSIS — Z23 Encounter for immunization: Secondary | ICD-10-CM

## 2016-12-03 LAB — CBC WITH DIFFERENTIAL/PLATELET
BASOS ABS: 0 10*3/uL (ref 0.0–0.1)
Basophils Relative: 0.8 % (ref 0.0–3.0)
Eosinophils Absolute: 0.1 10*3/uL (ref 0.0–0.7)
Eosinophils Relative: 1.8 % (ref 0.0–5.0)
HCT: 44.2 % (ref 39.0–52.0)
Hemoglobin: 15.1 g/dL (ref 13.0–17.0)
Lymphocytes Relative: 38.5 % (ref 12.0–46.0)
Lymphs Abs: 1.9 10*3/uL (ref 0.7–4.0)
MCHC: 34.3 g/dL (ref 30.0–36.0)
MCV: 89.6 fl (ref 78.0–100.0)
MONOS PCT: 8.4 % (ref 3.0–12.0)
Monocytes Absolute: 0.4 10*3/uL (ref 0.1–1.0)
NEUTROS ABS: 2.5 10*3/uL (ref 1.4–7.7)
NEUTROS PCT: 50.5 % (ref 43.0–77.0)
PLATELETS: 184 10*3/uL (ref 150.0–400.0)
RBC: 4.94 Mil/uL (ref 4.22–5.81)
RDW: 13.1 % (ref 11.5–15.5)
WBC: 4.9 10*3/uL (ref 4.0–10.5)

## 2016-12-03 LAB — BASIC METABOLIC PANEL
BUN: 13 mg/dL (ref 6–23)
CALCIUM: 9 mg/dL (ref 8.4–10.5)
CO2: 29 mEq/L (ref 19–32)
Chloride: 103 mEq/L (ref 96–112)
Creatinine, Ser: 0.82 mg/dL (ref 0.40–1.50)
GFR: 106.91 mL/min (ref >60.00)
GLUCOSE: 82 mg/dL (ref 70–99)
Potassium: 4.2 mEq/L (ref 3.5–5.1)
SODIUM: 139 meq/L (ref 135–145)

## 2016-12-03 LAB — LIPID PANEL
CHOL/HDL RATIO: 7
Cholesterol: 263 mg/dL — ABNORMAL HIGH (ref 0–200)
HDL: 39.8 mg/dL (ref >39.00)
NONHDL: 222.79
TRIGLYCERIDES: 227 mg/dL — AB (ref 0.0–149.0)
VLDL: 45.4 mg/dL — AB (ref 0.0–40.0)

## 2016-12-03 LAB — POCT URINALYSIS DIPSTICK
BILIRUBIN UA: NEGATIVE
Glucose, UA: NEGATIVE
KETONES UA: NEGATIVE
Leukocytes, UA: NEGATIVE
NITRITE UA: NEGATIVE
Protein, UA: NEGATIVE
RBC UA: NEGATIVE
Spec Grav, UA: 1.015 (ref 1.010–1.025)
Urobilinogen, UA: 0.2 E.U./dL
pH, UA: 7 (ref 5.0–8.0)

## 2016-12-03 LAB — TSH: TSH: 2.94 u[IU]/mL (ref 0.35–4.50)

## 2016-12-03 LAB — PSA: PSA: 0.76 ng/mL (ref 0.10–4.00)

## 2016-12-03 LAB — HEPATIC FUNCTION PANEL
ALK PHOS: 42 U/L (ref 39–117)
ALT: 17 U/L (ref 0–53)
AST: 16 U/L (ref 0–37)
Albumin: 4 g/dL (ref 3.5–5.2)
BILIRUBIN DIRECT: 0.1 mg/dL (ref 0.0–0.3)
BILIRUBIN TOTAL: 0.5 mg/dL (ref 0.2–1.2)
TOTAL PROTEIN: 6.5 g/dL (ref 6.0–8.3)

## 2016-12-03 LAB — LDL CHOLESTEROL, DIRECT: Direct LDL: 176 mg/dL

## 2016-12-03 NOTE — Progress Notes (Signed)
Dylan Kline is a 46 year old married male nonsmoker........ 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 who comes in today for general physical examination  He's always been in excellent health he said no chronic health problems. 1 point he had elevated lipids. We started him on lipid-lowering medication however with diet and exercise he stopped his Zocor. Last lipid panel 2 years ago was normal.  He gets routine eye care, dental care, colonoscopy not too age 35  Family history pertinent but his mother and father high cholesterol distended prostate cancer at age 43+. One brother in good health.  Vaccinations tetanus booster given today seasonal flu shot given at work.  BP 120/78 (BP Location: Left Arm, Patient Position: Sitting, Cuff Size: Normal)   Pulse 86   Temp 98.4 F (36.9 C) (Oral)   Ht 6' (1.829 m)   Wt 189 lb (85.7 kg)   BMI 25.63 kg/m  Is a well-developed well-nourished male no acute distress vital signs stable he is afebrile HEENT negative except for left cerumen impaction. Neck was supple thyroid not enlarged no carotid bruits cardiopulmonary exam normal abdominal exam normal genitalia normal circumcised male rectum normal stool guaiac-negative prostate normal extremities normal skin normal peripheral pulses normal except for chronic scar midline lower lip from previous oral surgery as a child.  #1 healthy male  #2 history of hyperlipidemia......... normal lipids on diet and exercise......Marland Kitchen recheck labs to be sure  #3 status post oral surgery........ childhood  #4 family history of prostate cancer father......... check PSA.

## 2016-12-03 NOTE — Patient Instructions (Signed)
Labs today.......... I will call you if anything is abnormal  Continue good diet exercise and health habits  Follow-up in one year sooner if any problems

## 2017-10-11 IMAGING — CR DG WRIST COMPLETE 3+V*L*
4 series · 4 of 4 positions shown · non-contrast
Comparison: None.

CLINICAL DATA: Patient hurt his left wrist getting a heavy suitcase
out of a SUV yesterday; pain is lateral left wrist/distal lateral
forearm

EXAM:
LEFT WRIST - COMPLETE 3+ VIEW

[x wrist pa left]
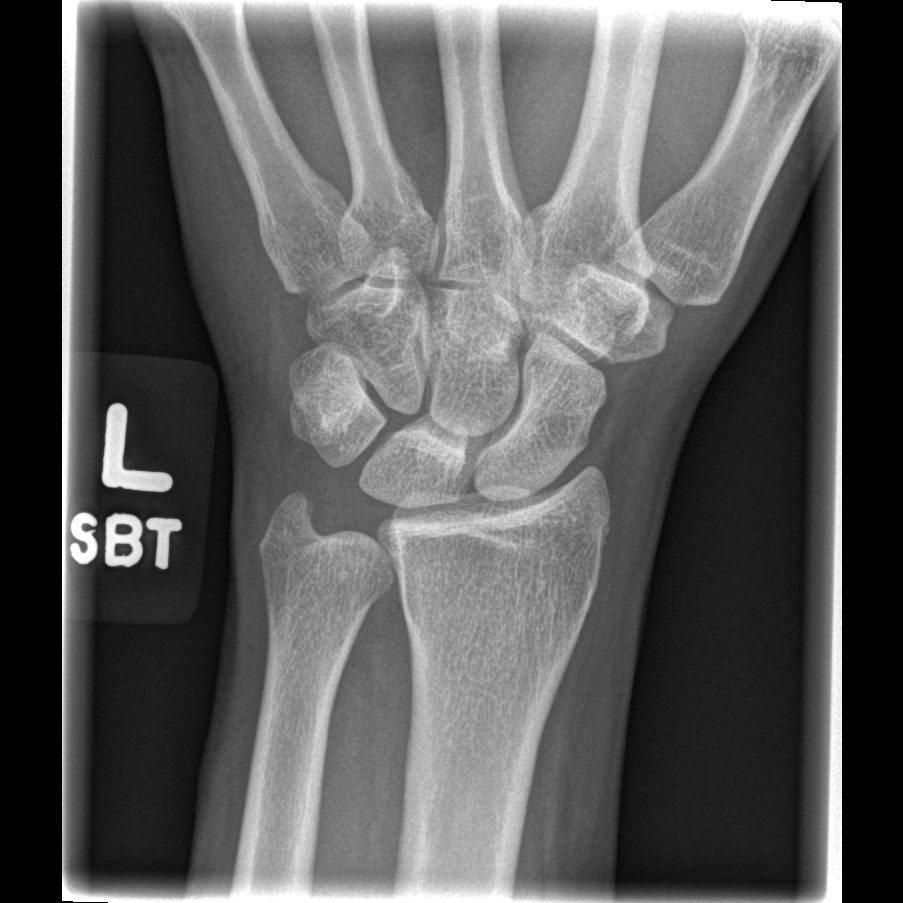

[x wrist obl left]
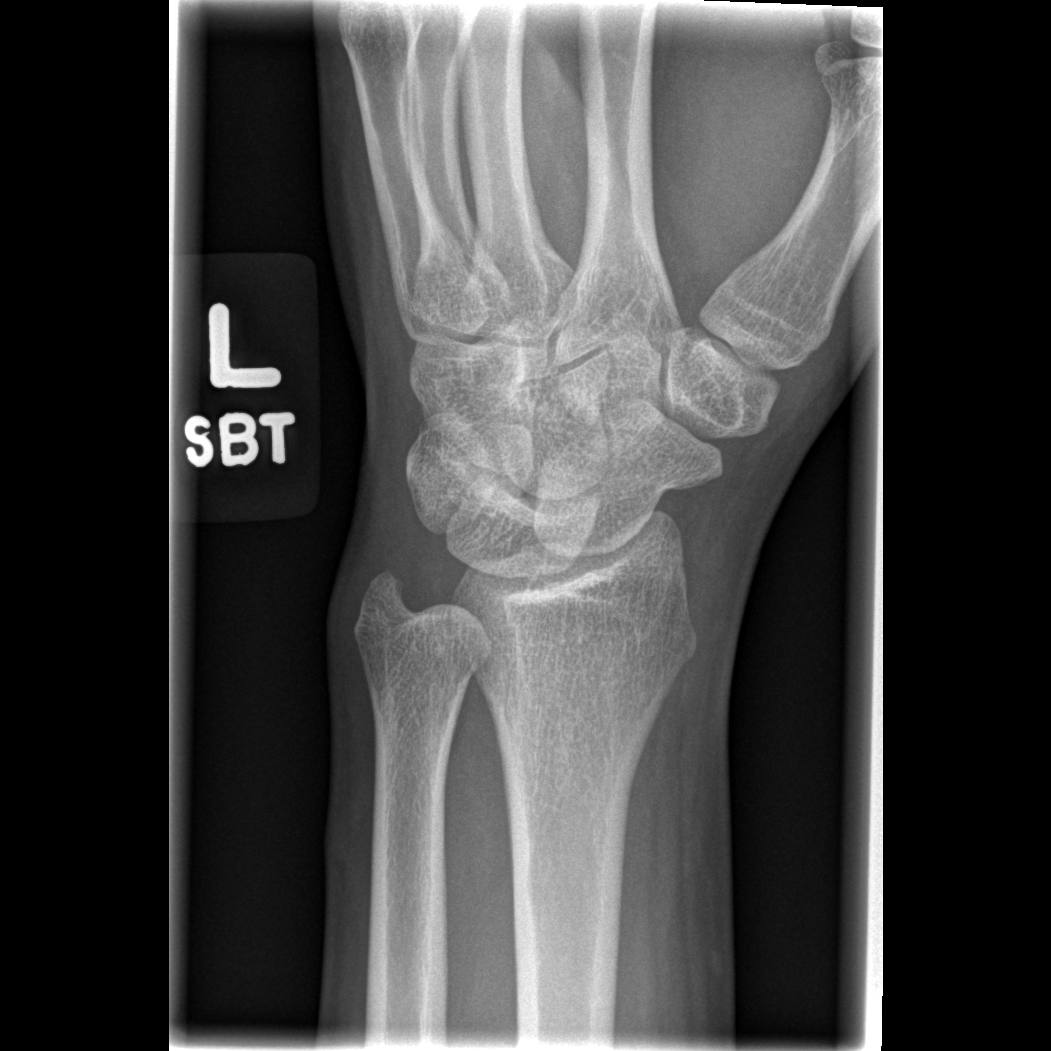

[x wrist lat left]
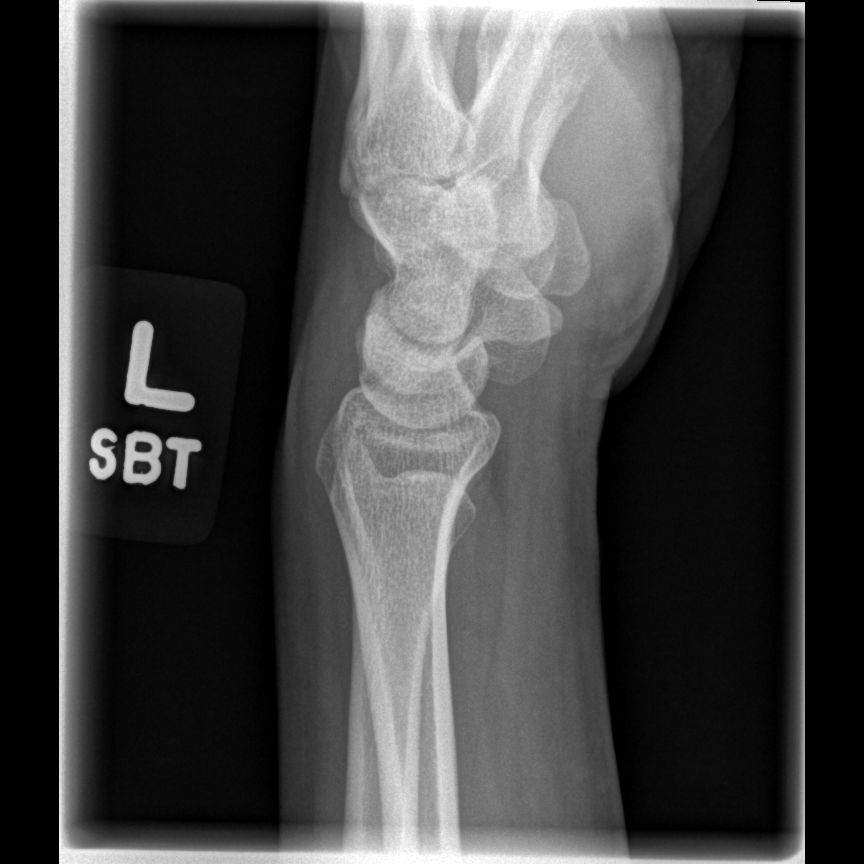

[x navicular]
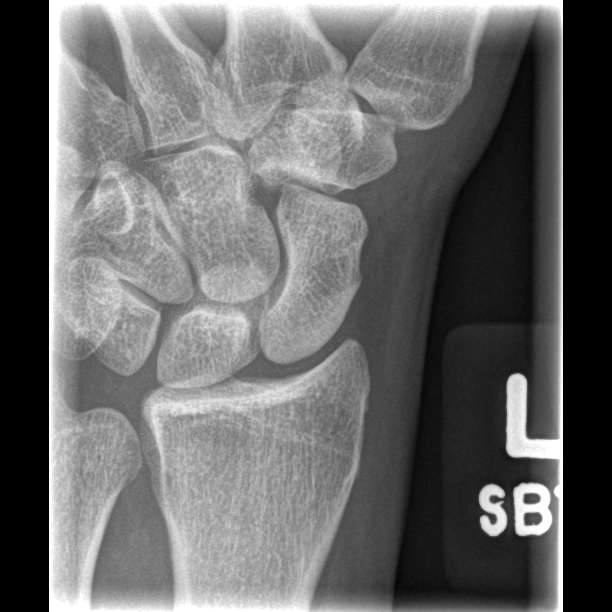

[4 of 4 positions shown; findings below may reference images not displayed]

FINDINGS: There is no evidence of fracture or dislocation. There is no
evidence of arthropathy or other focal bone abnormality. Soft
tissues are unremarkable.
IMPRESSION: Negative.

## 2018-08-13 DIAGNOSIS — H1130 Conjunctival hemorrhage, unspecified eye: Secondary | ICD-10-CM | POA: Diagnosis not present

## 2019-01-02 DIAGNOSIS — Z20828 Contact with and (suspected) exposure to other viral communicable diseases: Secondary | ICD-10-CM | POA: Diagnosis not present

## 2019-09-20 DIAGNOSIS — Z20822 Contact with and (suspected) exposure to covid-19: Secondary | ICD-10-CM | POA: Diagnosis not present

## 2019-09-24 DIAGNOSIS — Z20828 Contact with and (suspected) exposure to other viral communicable diseases: Secondary | ICD-10-CM | POA: Diagnosis not present

## 2020-06-28 ENCOUNTER — Ambulatory Visit (INDEPENDENT_AMBULATORY_CARE_PROVIDER_SITE_OTHER): Payer: BC Managed Care – PPO | Admitting: Internal Medicine

## 2020-06-28 ENCOUNTER — Other Ambulatory Visit: Payer: Self-pay

## 2020-06-28 ENCOUNTER — Encounter: Payer: Self-pay | Admitting: Internal Medicine

## 2020-06-28 VITALS — BP 120/82 | HR 85 | Temp 97.7°F | Ht 72.0 in | Wt 189.0 lb

## 2020-06-28 DIAGNOSIS — E7841 Elevated Lipoprotein(a): Secondary | ICD-10-CM | POA: Diagnosis not present

## 2020-06-28 DIAGNOSIS — Z0000 Encounter for general adult medical examination without abnormal findings: Secondary | ICD-10-CM

## 2020-06-28 DIAGNOSIS — R252 Cramp and spasm: Secondary | ICD-10-CM

## 2020-06-28 DIAGNOSIS — Z23 Encounter for immunization: Secondary | ICD-10-CM

## 2020-06-28 DIAGNOSIS — Z1211 Encounter for screening for malignant neoplasm of colon: Secondary | ICD-10-CM

## 2020-06-28 LAB — COMPREHENSIVE METABOLIC PANEL
ALT: 25 U/L (ref 0–53)
AST: 17 U/L (ref 0–37)
Albumin: 4.4 g/dL (ref 3.5–5.2)
Alkaline Phosphatase: 49 U/L (ref 39–117)
BUN: 13 mg/dL (ref 6–23)
CO2: 25 mEq/L (ref 19–32)
Calcium: 9 mg/dL (ref 8.4–10.5)
Chloride: 105 mEq/L (ref 96–112)
Creatinine, Ser: 0.91 mg/dL (ref 0.40–1.50)
GFR: 98.05 mL/min (ref >60.00)
Glucose, Bld: 94 mg/dL (ref 70–99)
Potassium: 4 mEq/L (ref 3.5–5.1)
Sodium: 138 mEq/L (ref 135–145)
Total Bilirubin: 0.6 mg/dL (ref 0.2–1.2)
Total Protein: 7.5 g/dL (ref 6.0–8.3)

## 2020-06-28 LAB — CBC WITH DIFFERENTIAL/PLATELET
Basophils Absolute: 0 10*3/uL (ref 0.0–0.1)
Basophils Relative: 0.7 % (ref 0.0–3.0)
Eosinophils Absolute: 0.1 10*3/uL (ref 0.0–0.7)
Eosinophils Relative: 1.4 % (ref 0.0–5.0)
HCT: 44 % (ref 39.0–52.0)
Hemoglobin: 15.2 g/dL (ref 13.0–17.0)
Lymphocytes Relative: 34.8 % (ref 12.0–46.0)
Lymphs Abs: 1.8 10*3/uL (ref 0.7–4.0)
MCHC: 34.6 g/dL (ref 30.0–36.0)
MCV: 88.6 fl (ref 78.0–100.0)
Monocytes Absolute: 0.4 10*3/uL (ref 0.1–1.0)
Monocytes Relative: 8.6 % (ref 3.0–12.0)
Neutro Abs: 2.8 10*3/uL (ref 1.4–7.7)
Neutrophils Relative %: 54.5 % (ref 43.0–77.0)
Platelets: 177 10*3/uL (ref 150.0–400.0)
RBC: 4.96 Mil/uL (ref 4.22–5.81)
RDW: 13 % (ref 11.5–15.5)
WBC: 5.1 10*3/uL (ref 4.0–10.5)

## 2020-06-28 LAB — LIPID PANEL
Cholesterol: 261 mg/dL — ABNORMAL HIGH (ref 0–200)
HDL: 44.7 mg/dL (ref >39.00)
LDL Cholesterol: 189 mg/dL — ABNORMAL HIGH (ref 0–99)
NonHDL: 216.74
Total CHOL/HDL Ratio: 6
Triglycerides: 140 mg/dL (ref 0.0–149.0)
VLDL: 28 mg/dL (ref 0.0–40.0)

## 2020-06-28 LAB — VITAMIN B12: Vitamin B-12: 221 pg/mL (ref 211–911)

## 2020-06-28 LAB — VITAMIN D 25 HYDROXY (VIT D DEFICIENCY, FRACTURES): VITD: 18.86 ng/mL — ABNORMAL LOW (ref 30.00–100.00)

## 2020-06-28 LAB — MAGNESIUM: Magnesium: 2.1 mg/dL (ref 1.5–2.5)

## 2020-06-28 LAB — TSH: TSH: 2.55 u[IU]/mL (ref 0.35–4.50)

## 2020-06-28 LAB — HEMOGLOBIN A1C: Hgb A1c MFr Bld: 5.5 % (ref 4.6–6.5)

## 2020-06-28 NOTE — Addendum Note (Signed)
Addended by: Amanda Cockayne on: 06/28/2020 11:35 AM   Modules accepted: Orders

## 2020-06-28 NOTE — Patient Instructions (Addendum)
-  Nice seeing you today!!  -Lab work today; will notify you once results are available.  -first shingles vaccine today.  -Schedule follow up in 1 year or sooner as needed.

## 2020-06-28 NOTE — Progress Notes (Signed)
New Patient Office Visit     This visit occurred during the SARS-CoV-2 public health emergency.  Safety protocols were in place, including screening questions prior to the visit, additional usage of staff PPE, and extensive cleaning of exam room while observing appropriate contact time as indicated for disinfecting solutions.    CC/Reason for Visit: Establish care, annual preventive exam Previous PCP: Dr. Stevie Kern Last Visit: 2018  HPI: Dylan Kline is a 50 y.o. male who is coming in today for the above mentioned reasons. Past Medical History is significant for: Hyperlipidemia who had been on simvastatin one point but was taken off of it after cholesterol normalized.  He has otherwise been healthy.  He has not had routine health care since 2018.  He has been having some foot cramps.  He has completed all 4 COVID vaccines, had a Tdap in 2018.  He is overdue for initial screening colonoscopy.  He is overdue for eye care, he has routine dental care.  He is a never smoker, drinks alcohol occasionally.  He has 2 daughters in college, is divorced, he is an Forensic psychologist.  His family history significant for father with prostate cancer and paternal grandfather with coronary artery disease.   Past Medical/Surgical History: Past Medical History:  Diagnosis Date   Hyperlipidemia     Past Surgical History:  Procedure Laterality Date   CLEFT PALATE REPAIR     WISDOM TOOTH EXTRACTION      Social History:  reports that he has never smoked. He has never used smokeless tobacco. He reports current alcohol use. No history on file for drug use.  Allergies: No Known Allergies  Family History:  Family History  Problem Relation Age of Onset   Allergies Mother    Hyperlipidemia Mother    Heart disease Mother    Hyperlipidemia Father    Cancer Father        prostate    No current outpatient medications on file.  Review of Systems:  Constitutional: Denies fever, chills, diaphoresis, appetite  change and fatigue.  HEENT: Denies photophobia, eye pain, redness, hearing loss, ear pain, congestion, sore throat, rhinorrhea, sneezing, mouth sores, trouble swallowing, neck pain, neck stiffness and tinnitus.   Respiratory: Denies SOB, DOE, cough, chest tightness,  and wheezing.   Cardiovascular: Denies chest pain, palpitations and leg swelling.  Gastrointestinal: Denies nausea, vomiting, abdominal pain, diarrhea, constipation, blood in stool and abdominal distention.  Genitourinary: Denies dysuria, urgency, frequency, hematuria, flank pain and difficulty urinating.  Endocrine: Denies: hot or cold intolerance, sweats, changes in hair or nails, polyuria, polydipsia. Musculoskeletal: Denies myalgias, back pain, joint swelling, arthralgias and gait problem.  Skin: Denies pallor, rash and wound.  Neurological: Denies dizziness, seizures, syncope, weakness, light-headedness, numbness and headaches.  Hematological: Denies adenopathy. Easy bruising, personal or family bleeding history  Psychiatric/Behavioral: Denies suicidal ideation, mood changes, confusion, nervousness, sleep disturbance and agitation    Physical Exam: Vitals:   06/28/20 1058  BP: 120/82  Pulse: 85  Temp: 97.7 F (36.5 C)  TempSrc: Oral  SpO2: 97%  Weight: 189 lb (85.7 kg)  Height: 6' (1.829 m)   Body mass index is 25.63 kg/m.  Constitutional: NAD, calm, comfortable Eyes: PERRL, lids and conjunctivae normal ENMT: Mucous membranes are moist. Posterior pharynx clear of any exudate or lesions. Normal dentition. Tympanic membrane is pearly white, no erythema or bulging.  Has had surgery for cleft lip and palate in the past. Neck: normal, supple, no masses, no thyromegaly Respiratory: clear  to auscultation bilaterally, no wheezing, no crackles. Normal respiratory effort. No accessory muscle use.  Cardiovascular: Regular rate and rhythm, no murmurs / rubs / gallops. No extremity edema. 2+ pedal pulses. No carotid bruits.   Abdomen: no tenderness, no masses palpated. No hepatosplenomegaly. Bowel sounds positive.  Musculoskeletal: no clubbing / cyanosis. No joint deformity upper and lower extremities. Good ROM, no contractures. Normal muscle tone.  Skin: no rashes, lesions, ulcers. No induration Neurologic: CN 2-12 grossly intact. Sensation intact, DTR normal. Strength 5/5 in all 4.  Psychiatric: Normal judgment and insight. Alert and oriented x 3. Normal mood.    Impression and Plan:  Encounter for preventive health examination  - Plan: CBC with Differential/Platelet, Hemoglobin A1c, Lipid panel, TSH, Vitamin B12, VITAMIN D 25 Hydroxy (Vit-D Deficiency, Fractures) -Advised routine eye and dental care. -He will get first shingles vaccine today, otherwise up-to-date. -Screening labs today. -Healthy lifestyle discussed in detail. -Refer to GI for initial screening colonoscopy.  Need for shingles vaccine -First shingles vaccine administered today.  Elevated lipoprotein(a) -Check lipids today.  Leg cramps  - Plan: Comprehensive metabolic panel, Magnesium    Patient Instructions  -Nice seeing you today!!  -Lab work today; will notify you once results are available.  -first shingles vaccine today.  -Schedule follow up in 1 year or sooner as needed.        Lelon Frohlich, MD [facility]

## 2020-06-28 NOTE — Addendum Note (Signed)
Addended by: Gwenyth Ober R on: 06/28/2020 01:17 PM   Modules accepted: Orders

## 2020-06-29 ENCOUNTER — Other Ambulatory Visit: Payer: Self-pay | Admitting: Internal Medicine

## 2020-06-29 ENCOUNTER — Encounter: Payer: Self-pay | Admitting: Internal Medicine

## 2020-06-29 DIAGNOSIS — E559 Vitamin D deficiency, unspecified: Secondary | ICD-10-CM | POA: Insufficient documentation

## 2020-06-29 DIAGNOSIS — E782 Mixed hyperlipidemia: Secondary | ICD-10-CM

## 2020-06-29 MED ORDER — ATORVASTATIN CALCIUM 40 MG PO TABS: 40.0000 mg | ORAL_TABLET | Freq: Every day | ORAL | 1 refills | Status: DC

## 2020-06-29 MED ORDER — VITAMIN D (ERGOCALCIFEROL) 1.25 MG (50000 UNIT) PO CAPS: 50000.0000 [IU] | ORAL_CAPSULE | ORAL | 0 refills | Status: DC

## 2020-07-14 ENCOUNTER — Telehealth: Payer: Self-pay | Admitting: Internal Medicine

## 2020-07-14 ENCOUNTER — Other Ambulatory Visit: Payer: Self-pay | Admitting: Internal Medicine

## 2020-07-14 DIAGNOSIS — E559 Vitamin D deficiency, unspecified: Secondary | ICD-10-CM

## 2020-07-14 DIAGNOSIS — E782 Mixed hyperlipidemia: Secondary | ICD-10-CM

## 2020-07-14 NOTE — Telephone Encounter (Signed)
Pt is calling to schedule his labs but the order for Lipid panel (repeat in 3 months per notes in lab results) are not in the system and he would like to have it done on the same day as his shingle vaccine 10/28/2020.  May we have orders for the labs?

## 2020-07-14 NOTE — Telephone Encounter (Signed)
Orders for Lipid and Vitamin D placed

## 2020-07-15 NOTE — Telephone Encounter (Signed)
Pt was called lmom for pt to call back to get labs scheduled.

## 2020-09-13 ENCOUNTER — Other Ambulatory Visit: Payer: Self-pay | Admitting: Internal Medicine

## 2020-09-13 DIAGNOSIS — E559 Vitamin D deficiency, unspecified: Secondary | ICD-10-CM

## 2020-09-29 ENCOUNTER — Other Ambulatory Visit: Payer: BC Managed Care – PPO

## 2020-09-29 ENCOUNTER — Ambulatory Visit (INDEPENDENT_AMBULATORY_CARE_PROVIDER_SITE_OTHER): Payer: BC Managed Care – PPO

## 2020-09-29 ENCOUNTER — Other Ambulatory Visit (INDEPENDENT_AMBULATORY_CARE_PROVIDER_SITE_OTHER): Payer: BC Managed Care – PPO

## 2020-09-29 ENCOUNTER — Other Ambulatory Visit: Payer: Self-pay

## 2020-09-29 DIAGNOSIS — Z23 Encounter for immunization: Secondary | ICD-10-CM | POA: Diagnosis not present

## 2020-09-29 DIAGNOSIS — E559 Vitamin D deficiency, unspecified: Secondary | ICD-10-CM

## 2020-09-29 DIAGNOSIS — E782 Mixed hyperlipidemia: Secondary | ICD-10-CM

## 2020-09-29 LAB — LIPID PANEL
Cholesterol: 136 mg/dL (ref 0–200)
HDL: 44.7 mg/dL (ref >39.00)
LDL Cholesterol: 70 mg/dL (ref 0–99)
NonHDL: 91.65
Total CHOL/HDL Ratio: 3
Triglycerides: 107 mg/dL (ref 0.0–149.0)
VLDL: 21.4 mg/dL (ref 0.0–40.0)

## 2020-09-29 LAB — VITAMIN D 25 HYDROXY (VIT D DEFICIENCY, FRACTURES): VITD: 41.36 ng/mL (ref 30.00–100.00)

## 2020-09-29 NOTE — Progress Notes (Signed)
Per orders of Dr. Jerilee Hoh, injection of shingrix given by Rebecca Eaton. Patient tolerated injection well.

## 2020-10-28 ENCOUNTER — Ambulatory Visit: Payer: BC Managed Care – PPO

## 2020-12-01 ENCOUNTER — Other Ambulatory Visit: Payer: Self-pay | Admitting: Internal Medicine

## 2020-12-01 DIAGNOSIS — E559 Vitamin D deficiency, unspecified: Secondary | ICD-10-CM

## 2020-12-10 ENCOUNTER — Other Ambulatory Visit: Payer: Self-pay | Admitting: Internal Medicine

## 2020-12-10 DIAGNOSIS — E559 Vitamin D deficiency, unspecified: Secondary | ICD-10-CM

## 2020-12-14 ENCOUNTER — Encounter: Payer: Self-pay | Admitting: Internal Medicine

## 2020-12-20 ENCOUNTER — Other Ambulatory Visit: Payer: Self-pay | Admitting: Internal Medicine

## 2020-12-20 DIAGNOSIS — E782 Mixed hyperlipidemia: Secondary | ICD-10-CM

## 2021-01-03 DIAGNOSIS — H524 Presbyopia: Secondary | ICD-10-CM | POA: Diagnosis not present

## 2021-01-03 DIAGNOSIS — H52203 Unspecified astigmatism, bilateral: Secondary | ICD-10-CM | POA: Diagnosis not present

## 2021-01-03 DIAGNOSIS — H18593 Other hereditary corneal dystrophies, bilateral: Secondary | ICD-10-CM | POA: Diagnosis not present

## 2021-01-05 DIAGNOSIS — Z20822 Contact with and (suspected) exposure to covid-19: Secondary | ICD-10-CM | POA: Diagnosis not present

## 2021-02-21 ENCOUNTER — Encounter: Payer: Self-pay | Admitting: Internal Medicine

## 2021-02-21 ENCOUNTER — Telehealth (INDEPENDENT_AMBULATORY_CARE_PROVIDER_SITE_OTHER): Payer: BC Managed Care – PPO | Admitting: Internal Medicine

## 2021-02-21 ENCOUNTER — Other Ambulatory Visit: Payer: Self-pay

## 2021-02-21 VITALS — Temp 98.0°F | Wt 189.0 lb

## 2021-02-21 DIAGNOSIS — U071 COVID-19: Secondary | ICD-10-CM | POA: Diagnosis not present

## 2021-02-21 MED ORDER — NIRMATRELVIR/RITONAVIR (PAXLOVID)TABLET: 3.0000 | ORAL_TABLET | Freq: Two times a day (BID) | ORAL | 0 refills | Status: AC

## 2021-02-21 NOTE — Progress Notes (Signed)
Virtual Visit via Video Note  I connected with Dylan Kline on 02/21/21 at  3:00 PM EST by a video enabled telemedicine application and verified that I am speaking with the correct person using two identifiers.  Location patient: home Location provider: work office Persons participating in the virtual visit: patient, provider  I discussed the limitations of evaluation and management by telemedicine and the availability of in person appointments. The patient expressed understanding and agreed to proceed.   HPI: He has scheduled this visit to tell me that he tested positive for COVID today.  On Friday he went skiing.  Over the weekend he had significant body aches that he thought were related to that.  The next day he started having postnasal drip, rhinorrhea and sore throat that prompted him to take the COVID test today.  He has not had fever or shortness of breath.   ROS: Constitutional: Denies fever, chills, diaphoresis, appetite change. HEENT: Denies photophobia, eye pain, redness, mouth sores, trouble swallowing, neck pain, neck stiffness and tinnitus.   Respiratory: Denies SOB, DOE,  chest tightness,  and wheezing.   Cardiovascular: Denies chest pain, palpitations and leg swelling.  Gastrointestinal: Denies nausea, vomiting, abdominal pain, diarrhea, constipation, blood in stool and abdominal distention.  Genitourinary: Denies dysuria, urgency, frequency, hematuria, flank pain and difficulty urinating.  Endocrine: Denies: hot or cold intolerance, sweats, changes in hair or nails, polyuria, polydipsia. Musculoskeletal: Denies myalgias, back pain, joint swelling, arthralgias and gait problem.  Skin: Denies pallor, rash and wound.  Neurological: Denies dizziness, seizures, syncope, weakness, light-headedness, numbness and headaches.  Hematological: Denies adenopathy. Easy bruising, personal or family bleeding history  Psychiatric/Behavioral: Denies suicidal ideation, mood changes,  confusion, nervousness, sleep disturbance and agitation   Past Medical History:  Diagnosis Date   Hyperlipidemia     Past Surgical History:  Procedure Laterality Date   CLEFT PALATE REPAIR     VASECTOMY     WISDOM TOOTH EXTRACTION      Family History  Problem Relation Age of Onset   Allergies Mother    Hyperlipidemia Mother    Heart disease Mother    Hyperlipidemia Father    Cancer Father        prostate    SOCIAL HX:   reports that he has never smoked. He has never used smokeless tobacco. He reports current alcohol use. No history on file for drug use.   Current Outpatient Medications:    atorvastatin (LIPITOR) 40 MG tablet, TAKE 1 TABLET BY MOUTH EVERY DAY, Disp: 90 tablet, Rfl: 1   Cholecalciferol (VITAMIN D3 PO), Take 50 mg by mouth daily., Disp: , Rfl:    Cyanocobalamin (B-12 PO), Take 1,000 mg by mouth daily., Disp: , Rfl:    nirmatrelvir/ritonavir EUA (PAXLOVID) 20 x 150 MG & 10 x 100MG  TABS, Take 3 tablets by mouth 2 (two) times daily for 5 days. (Take nirmatrelvir 150 mg two tablets twice daily for 5 days and ritonavir 100 mg one tablet twice daily for 5 days) Patient GFR is 98, Disp: 30 tablet, Rfl: 0  EXAM:   VITALS per patient if applicable: None reported  GENERAL: alert, oriented, appears well and in no acute distress  HEENT: atraumatic, conjunttiva clear, no obvious abnormalities on inspection of external nose and ears  NECK: normal movements of the head and neck  LUNGS: on inspection no signs of respiratory distress, breathing rate appears normal, no obvious gross increased work of breathing, gasping or wheezing  CV: no obvious  cyanosis  MS: moves all visible extremities without noticeable abnormality  PSYCH/NEURO: pleasant and cooperative, no obvious depression or anxiety, speech and thought processing grossly intact  ASSESSMENT AND PLAN:   COVID-19  - Plan: nirmatrelvir/ritonavir EUA (PAXLOVID) 20 x 150 MG & 10 x 100MG  TABS -He may also use  OTC medications such as antihistamines, decongestants, pain relievers, guaifenesin. -We have reviewed quarantine period of 5 days. -We have discussed symptoms that would promote ED evaluation. -He knows to follow with [place] if symptoms fail to resolve.    I discussed the assessment and treatment plan with the patient. The patient was provided an opportunity to ask questions and all were answered. The patient agreed with the plan and demonstrated an understanding of the instructions.   The patient was advised to call back or seek an in-person evaluation if the symptoms worsen or if the condition fails to improve as anticipated.    Lelon Frohlich, MD   Primary [facility] at [facility]

## 2021-06-16 ENCOUNTER — Other Ambulatory Visit: Payer: Self-pay | Admitting: Internal Medicine

## 2021-06-16 DIAGNOSIS — E782 Mixed hyperlipidemia: Secondary | ICD-10-CM

## 2021-08-23 ENCOUNTER — Telehealth: Payer: Self-pay | Admitting: Internal Medicine

## 2021-08-23 DIAGNOSIS — Z1211 Encounter for screening for malignant neoplasm of colon: Secondary | ICD-10-CM

## 2021-08-23 NOTE — Telephone Encounter (Signed)
Referral placed.

## 2021-08-23 NOTE — Telephone Encounter (Signed)
Pt is requesting a referral for a colonoscopy.

## 2021-09-10 ENCOUNTER — Other Ambulatory Visit: Payer: Self-pay | Admitting: Internal Medicine

## 2021-09-10 DIAGNOSIS — E782 Mixed hyperlipidemia: Secondary | ICD-10-CM

## 2021-10-03 ENCOUNTER — Encounter: Payer: Self-pay | Admitting: Internal Medicine

## 2021-10-10 ENCOUNTER — Ambulatory Visit (INDEPENDENT_AMBULATORY_CARE_PROVIDER_SITE_OTHER): Payer: BC Managed Care – PPO | Admitting: Internal Medicine

## 2021-10-10 ENCOUNTER — Encounter: Payer: Self-pay | Admitting: Internal Medicine

## 2021-10-10 VITALS — BP 110/80 | HR 85 | Temp 97.6°F | Ht 72.5 in | Wt 191.1 lb

## 2021-10-10 DIAGNOSIS — E782 Mixed hyperlipidemia: Secondary | ICD-10-CM

## 2021-10-10 DIAGNOSIS — Z0000 Encounter for general adult medical examination without abnormal findings: Secondary | ICD-10-CM | POA: Diagnosis not present

## 2021-10-10 DIAGNOSIS — H6122 Impacted cerumen, left ear: Secondary | ICD-10-CM | POA: Diagnosis not present

## 2021-10-10 DIAGNOSIS — E559 Vitamin D deficiency, unspecified: Secondary | ICD-10-CM | POA: Diagnosis not present

## 2021-10-10 LAB — COMPREHENSIVE METABOLIC PANEL
ALT: 19 U/L (ref 0–53)
AST: 14 U/L (ref 0–37)
Albumin: 4.2 g/dL (ref 3.5–5.2)
Alkaline Phosphatase: 51 U/L (ref 39–117)
BUN: 12 mg/dL (ref 6–23)
CO2: 28 mEq/L (ref 19–32)
Calcium: 8.7 mg/dL (ref 8.4–10.5)
Chloride: 104 mEq/L (ref 96–112)
Creatinine, Ser: 1.01 mg/dL (ref 0.40–1.50)
GFR: 85.74 mL/min (ref >60.00)
Glucose, Bld: 97 mg/dL (ref 70–99)
Potassium: 4.3 mEq/L (ref 3.5–5.1)
Sodium: 139 mEq/L (ref 135–145)
Total Bilirubin: 0.5 mg/dL (ref 0.2–1.2)
Total Protein: 6.9 g/dL (ref 6.0–8.3)

## 2021-10-10 LAB — LIPID PANEL
Cholesterol: 157 mg/dL (ref 0–200)
HDL: 42.9 mg/dL (ref >39.00)
LDL Cholesterol: 90 mg/dL (ref 0–99)
NonHDL: 114.49
Total CHOL/HDL Ratio: 4
Triglycerides: 124 mg/dL (ref 0.0–149.0)
VLDL: 24.8 mg/dL (ref 0.0–40.0)

## 2021-10-10 LAB — VITAMIN D 25 HYDROXY (VIT D DEFICIENCY, FRACTURES): VITD: 23.59 ng/mL — ABNORMAL LOW (ref 30.00–100.00)

## 2021-10-10 LAB — CBC WITH DIFFERENTIAL/PLATELET
Basophils Absolute: 0 10*3/uL (ref 0.0–0.1)
Basophils Relative: 0.9 % (ref 0.0–3.0)
Eosinophils Absolute: 0.2 10*3/uL (ref 0.0–0.7)
Eosinophils Relative: 3.4 % (ref 0.0–5.0)
HCT: 43.7 % (ref 39.0–52.0)
Hemoglobin: 15.4 g/dL (ref 13.0–17.0)
Lymphocytes Relative: 39.8 % (ref 12.0–46.0)
Lymphs Abs: 2.2 10*3/uL (ref 0.7–4.0)
MCHC: 35.2 g/dL (ref 30.0–36.0)
MCV: 88.5 fl (ref 78.0–100.0)
Monocytes Absolute: 0.5 10*3/uL (ref 0.1–1.0)
Monocytes Relative: 9.7 % (ref 3.0–12.0)
Neutro Abs: 2.5 10*3/uL (ref 1.4–7.7)
Neutrophils Relative %: 46.2 % (ref 43.0–77.0)
Platelets: 181 10*3/uL (ref 150.0–400.0)
RBC: 4.94 Mil/uL (ref 4.22–5.81)
RDW: 12.8 % (ref 11.5–15.5)
WBC: 5.4 10*3/uL (ref 4.0–10.5)

## 2021-10-10 LAB — PSA: PSA: 0.91 ng/mL (ref 0.10–4.00)

## 2021-10-10 NOTE — Progress Notes (Addendum)
Established Patient Office Visit     CC/Reason for Visit: Annual preventive exam  HPI: Dylan Kline is a 51 y.o. male who is coming in today for the above mentioned reasons. Past Medical History is significant for: Hyperlipidemia on atorvastatin 40 mg, vitamin D deficiency.  He has been doing well and has no acute concerns or complaints.  He needs some insurance forms filled out today.  He had his flu vaccine last week, will be getting COVID-vaccination later this fall.  He has his initial screening colonoscopy scheduled for October.  Eye and dental exams are up-to-date   Past Medical/Surgical History: Past Medical History:  Diagnosis Date   Hyperlipidemia     Past Surgical History:  Procedure Laterality Date   CLEFT PALATE REPAIR     VASECTOMY     WISDOM TOOTH EXTRACTION      Social History:  reports that he has never smoked. He has never used smokeless tobacco. He reports current alcohol use. No history on file for drug use.  Allergies: No Known Allergies  Family History: Family History  Problem Relation Age of Onset   Allergies Mother    Hyperlipidemia Mother    Heart disease Mother    Hyperlipidemia Father    Cancer Father        prostate     Current Outpatient Medications:    atorvastatin (LIPITOR) 40 MG tablet, TAKE 1 TABLET BY MOUTH EVERY DAY, Disp: 90 tablet, Rfl: 1  Review of Systems:  Constitutional: Denies fever, chills, diaphoresis, appetite change and fatigue.  HEENT: Denies photophobia, eye pain, redness, hearing loss, ear pain, congestion, sore throat, rhinorrhea, sneezing, mouth sores, trouble swallowing, neck pain, neck stiffness and tinnitus.   Respiratory: Denies SOB, DOE, cough, chest tightness,  and wheezing.   Cardiovascular: Denies chest pain, palpitations and leg swelling.  Gastrointestinal: Denies nausea, vomiting, abdominal pain, diarrhea, constipation, blood in stool and abdominal distention.  Genitourinary: Denies dysuria, urgency,  frequency, hematuria, flank pain and difficulty urinating.  Endocrine: Denies: hot or cold intolerance, sweats, changes in hair or nails, polyuria, polydipsia. Musculoskeletal: Denies myalgias, back pain, joint swelling, arthralgias and gait problem.  Skin: Denies pallor, rash and wound.  Neurological: Denies dizziness, seizures, syncope, weakness, light-headedness, numbness and headaches.  Hematological: Denies adenopathy. Easy bruising, personal or family bleeding history  Psychiatric/Behavioral: Denies suicidal ideation, mood changes, confusion, nervousness, sleep disturbance and agitation    Physical Exam: Vitals:   10/10/21 0804  BP: 110/80  Pulse: 85  Temp: 97.6 F (36.4 C)  TempSrc: Oral  SpO2: 100%  Weight: 191 lb 1.6 oz (86.7 kg)  Height: 6' 0.5" (1.842 m)    Body mass index is 25.56 kg/m.   Constitutional: NAD, calm, comfortable Eyes: PERRL, lids and conjunctivae normal ENMT: Mucous membranes are moist. Posterior pharynx clear of any exudate or lesions, other than what is associated with his congenital cleft lip and palate. Normal dentition. Tympanic membrane is pearly white, no erythema or bulging on the right, left is obstructed by cerumen Neck: normal, supple, no masses, no thyromegaly Respiratory: clear to auscultation bilaterally, no wheezing, no crackles. Normal respiratory effort. No accessory muscle use.  Cardiovascular: Regular rate and rhythm, no murmurs / rubs / gallops. No extremity edema. 2+ pedal pulses. No carotid bruits.  Abdomen: no tenderness, no masses palpated. No hepatosplenomegaly. Bowel sounds positive.  Musculoskeletal: no clubbing / cyanosis. No joint deformity upper and lower extremities. Good ROM, no contractures. Normal muscle tone.  Skin: no rashes,  lesions, ulcers. No induration Neurologic: CN 2-12 grossly intact. Sensation intact, DTR normal. Strength 5/5 in all 4.  Psychiatric: Normal judgment and insight. Alert and oriented x 3. Normal  mood.    Impression and Plan:  Encounter for preventive health examination - Plan: PSA  Vitamin D deficiency - Plan: VITAMIN D 25 Hydroxy (Vit-D Deficiency, Fractures)  Mixed hyperlipidemia - Plan: CBC with Differential/Platelet, Comprehensive metabolic panel, Lipid panel  Left ear impacted cerumen   -Recommend routine eye and dental care. -Immunizations: He will get COVID at pharmacy, other immunizations are up-to-date -Healthy lifestyle discussed in detail. -Labs to be updated today. -Colon cancer screening: Initial screening scheduled for 10/23 -Breast cancer screening: Not applicable -Cervical cancer screening: Not applicable -Lung cancer screening: Never smoker, not applicable -Prostate cancer screening: PSA -DEXA: Not applicable  Cerumen Desimpaction  -After patient consent was obtained, warm water was applied and gentle ear lavage performed on left ear. There were no complications but unfortunately he was not able to tolerate continuous irrigation due to pain, disimpaction has not been completed.      Patient Instructions  -Nice seeing you today!!  -Lab work today; will notify you once results are available.  -Schedule follow up in 1 year or sooner as needed.      Lelon Frohlich, MD [facility] at [facility]

## 2021-10-10 NOTE — Patient Instructions (Signed)
-  Nice seeing you today!!  -Lab work today; will notify you once results are available.  -Schedule follow up in 1 year or sooner as needed. 

## 2021-10-11 ENCOUNTER — Other Ambulatory Visit: Payer: Self-pay | Admitting: Internal Medicine

## 2021-10-11 ENCOUNTER — Other Ambulatory Visit: Payer: Self-pay | Admitting: *Deleted

## 2021-10-11 DIAGNOSIS — E559 Vitamin D deficiency, unspecified: Secondary | ICD-10-CM

## 2021-10-11 MED ORDER — VITAMIN D (ERGOCALCIFEROL) 1.25 MG (50000 UNIT) PO CAPS: 50000.0000 [IU] | ORAL_CAPSULE | ORAL | 0 refills | Status: DC

## 2021-10-23 ENCOUNTER — Ambulatory Visit (AMBULATORY_SURGERY_CENTER): Payer: Self-pay

## 2021-10-23 VITALS — Ht 72.0 in | Wt 191.0 lb

## 2021-10-23 DIAGNOSIS — Z1211 Encounter for screening for malignant neoplasm of colon: Secondary | ICD-10-CM

## 2021-10-23 MED ORDER — NA SULFATE-K SULFATE-MG SULF 17.5-3.13-1.6 GM/177ML PO SOLN: 1.0000 | ORAL | 0 refills | Status: DC

## 2021-10-23 NOTE — Progress Notes (Signed)
No egg or soy allergy known to patient  No issues known to pt with past sedation with any surgeries or procedures Patient denies ever being told they had issues or difficulty with intubation  No FH of Malignant Hyperthermia Pt is not on diet pills Pt is not on  home 02  Pt is not on blood thinners  Pt denies issues with constipation  No A fib or A flutter Have any cardiac testing pending--denied Pt instructed to use [email or web address].[email or web address] or GoodRx for a price reduction on prep   

## 2021-11-03 ENCOUNTER — Encounter: Payer: Self-pay | Admitting: Internal Medicine

## 2021-11-13 ENCOUNTER — Ambulatory Visit (AMBULATORY_SURGERY_CENTER): Payer: BC Managed Care – PPO | Admitting: Internal Medicine

## 2021-11-13 ENCOUNTER — Encounter: Payer: Self-pay | Admitting: Internal Medicine

## 2021-11-13 VITALS — BP 111/81 | HR 80 | Temp 96.8°F | Resp 19 | Ht 72.0 in | Wt 191.0 lb

## 2021-11-13 DIAGNOSIS — D122 Benign neoplasm of ascending colon: Secondary | ICD-10-CM | POA: Diagnosis not present

## 2021-11-13 DIAGNOSIS — Z1211 Encounter for screening for malignant neoplasm of colon: Secondary | ICD-10-CM

## 2021-11-13 DIAGNOSIS — D123 Benign neoplasm of transverse colon: Secondary | ICD-10-CM | POA: Diagnosis not present

## 2021-11-13 MED ORDER — SODIUM CHLORIDE 0.9 % IV SOLN: 500.0000 mL | Freq: Once | INTRAVENOUS | Status: DC

## 2021-11-13 NOTE — Op Note (Signed)
[facility] Patient Name: Dylan Kline Procedure Date: 11/13/2021 10:24 AM MRN: [id number] Endoscopist: Docia Chuck. Henrene Pastor , MD, [id number] Age: 51 Referring MD:  Date of Birth: Nov 11, 1970 Gender: Male Account #: [id number] Procedure:                Colonoscopy with cold snare polypectomy x 2; biopsy                            polypectomy x 1 Indications:              Screening for colorectal malignant neoplasm Medicines:                Monitored Anesthesia Care Procedure:                Pre-Anesthesia Assessment:                           - Prior to the procedure, a History and Physical                            was performed, and patient medications and                            allergies were reviewed. The patient's tolerance of                            previous anesthesia was also reviewed. The risks                            and benefits of the procedure and the sedation                            options and risks were discussed with the patient.                            All questions were answered, and informed consent                            was obtained. Prior Anticoagulants: The patient has                            taken no anticoagulant or antiplatelet agents. ASA                            Grade Assessment: II - A patient with mild systemic                            disease. After reviewing the risks and benefits,                            the patient was deemed in satisfactory condition to                            undergo the procedure.  After obtaining informed consent, the colonoscope                            was passed under direct vision. Throughout the                            procedure, the patient's blood pressure, pulse, and                            oxygen saturations were monitored continuously. The                            CF [id number] #[id number] was introduced through the anus                            and  advanced to the the cecum, identified by                            appendiceal orifice and ileocecal valve. The                            ileocecal valve, appendiceal orifice, and rectum                            were photographed. The quality of the bowel                            preparation was excellent. The colonoscopy was                            performed without difficulty. The patient tolerated                            the procedure well. The bowel preparation used was                            SUPREP via split dose instruction. Scope In: 10:41:03 AM Scope Out: 10:55:09 AM Scope Withdrawal Time: 0 hours 12 minutes 28 seconds  Total Procedure Duration: 0 hours 14 minutes 6 seconds  Findings:                 Two polyps were found in the ascending colon. The                            polyps were 2 to 5 mm in size. These polyps were                            removed with a cold snare. Resection and retrieval                            were complete.                           A 1 mm polyp was found in  the transverse colon. The                            polyp was removed with a jumbo cold forceps.                            Resection and retrieval were complete.                           The exam was otherwise without abnormality on                            direct and retroflexion views. Complications:            No immediate complications. Estimated blood loss:                            None. Estimated Blood Loss:     Estimated blood loss: none. Impression:               - Two 2 to 5 mm polyps in the ascending colon,                            removed with a cold snare. Resected and retrieved.                           - One 1 mm polyp in the transverse colon, removed                            with a jumbo cold forceps. Resected and retrieved.                           - The examination was otherwise normal on direct                            and retroflexion  views. Recommendation:           - Repeat colonoscopy in 5 years for surveillance if                            all polyps adenomatous, otherwise 7 years.                           - Patient has a contact number available for                            emergencies. The signs and symptoms of potential                            delayed complications were discussed with the                            patient. Return to normal activities tomorrow.  Written discharge instructions were provided to the                            patient.                           - Resume previous diet.                           - Continue present medications.                           - Await pathology results. Docia Chuck. Henrene Pastor, MD 11/13/2021 11:02:27 AM This report has been signed electronically.

## 2021-11-13 NOTE — Progress Notes (Signed)
Sedate, gd SR, tolerated procedure well, VSS, report to RN 

## 2021-11-13 NOTE — Progress Notes (Signed)
Pt's states no medical or surgical changes since previsit or office visit. 

## 2021-11-13 NOTE — Patient Instructions (Signed)
Resume previous diet. - Continue present medications. - Await pathology results.  YOU HAD AN ENDOSCOPIC PROCEDURE TODAY: Refer to the procedure report and other information in the discharge instructions given to you for any specific questions about what was found during the examination. If this information does not answer your questions, please call [place] office at [phone] to clarify.   YOU SHOULD EXPECT: Some feelings of bloating in the abdomen. Passage of more gas than usual. Walking can help get rid of the air that was put into your GI tract during the procedure and reduce the bloating. If you had a lower endoscopy (such as a colonoscopy or flexible sigmoidoscopy) you may notice spotting of blood in your stool or on the toilet paper. Some abdominal soreness may be present for a day or two, also.  DIET: Your first meal following the procedure should be a light meal and then it is ok to progress to your normal diet. A half-sandwich or bowl of soup is an example of a good first meal. Heavy or fried foods are harder to digest and may make you feel nauseous or bloated. Drink plenty of fluids but you should avoid alcoholic beverages for 24 hours. If you had a esophageal dilation, please see attached instructions for diet.    ACTIVITY: Your care partner should take you home directly after the procedure. You should plan to take it easy, moving slowly for the rest of the day. You can resume normal activity the day after the procedure however YOU SHOULD NOT DRIVE, use power tools, machinery or perform tasks that involve climbing or major physical exertion for 24 hours (because of the sedation medicines used during the test).   SYMPTOMS TO REPORT IMMEDIATELY: A gastroenterologist can be reached at any hour. Please call [phone]  for any of the following symptoms:  Following lower endoscopy (colonoscopy, flexible sigmoidoscopy) Excessive amounts of blood in the stool  Significant tenderness,  worsening of abdominal pains  Swelling of the abdomen that is new, acute  Fever of 100 or higher   FOLLOW UP:  If any biopsies were taken you will be contacted by phone or by letter within the next 1-3 weeks. Call [phone]  if you have not heard about the biopsies in 3 weeks.  Please also call with any specific questions about appointments or follow up tests.

## 2021-11-13 NOTE — Progress Notes (Signed)
Called to room to assist during endoscopic procedure.  Patient ID and intended procedure confirmed with present staff. Received instructions for my participation in the procedure from the performing physician.  

## 2021-11-13 NOTE — Progress Notes (Signed)
HISTORY OF PRESENT ILLNESS:  Dylan Kline is a 51 y.o. male who presents today for routine screening colonoscopy.  No complaints  REVIEW OF SYSTEMS:  All non-GI ROS negative. Past Medical History:  Diagnosis Date   Hyperlipidemia     Past Surgical History:  Procedure Laterality Date   CLEFT PALATE REPAIR     VASECTOMY     WISDOM TOOTH EXTRACTION      Social History Dylan Kline  reports that he has never smoked. He has never used smokeless tobacco. He reports current alcohol use. He reports that he does not use drugs.  family history includes Allergies in his mother; Cancer in his father; Heart disease in his mother; Hyperlipidemia in his father and mother.  No Known Allergies     PHYSICAL EXAMINATION: Vital signs: BP (!) 138/108   Pulse 97   Temp (!) 96.8 F (36 C)   Ht 6' (1.829 m)   Wt 191 lb (86.6 kg)   SpO2 98%   BMI 25.90 kg/m  General: Well-developed, well-nourished, no acute distress HEENT: Sclerae are anicteric, conjunctiva pink. Oral mucosa intact Lungs: Clear Heart: Regular Abdomen: soft, nontender, nondistended, no obvious ascites, no peritoneal signs, normal bowel sounds. No organomegaly. Extremities: No edema Psychiatric: alert and oriented x3. Cooperative      ASSESSMENT:   Colon cancer screening  PLAN:  Screening colonoscopy

## 2021-11-14 ENCOUNTER — Telehealth: Payer: Self-pay | Admitting: *Deleted

## 2021-11-14 NOTE — Telephone Encounter (Signed)
  Follow up Call-     11/13/2021   10:01 AM  Call back number  Post procedure Call Back phone  # [phone]  Permission to leave phone message Yes     Patient questions:  Message left to call us if necessary.

## 2021-11-16 ENCOUNTER — Encounter: Payer: Self-pay | Admitting: Internal Medicine

## 2021-12-05 ENCOUNTER — Telehealth: Payer: BC Managed Care – PPO | Admitting: Internal Medicine

## 2021-12-05 ENCOUNTER — Telehealth: Payer: Self-pay | Admitting: *Deleted

## 2021-12-05 NOTE — Telephone Encounter (Signed)
Per Dr Jerilee Hoh,  if left index finger is still numb in 2 weeks, okay for otho referral.

## 2022-01-03 ENCOUNTER — Other Ambulatory Visit: Payer: Self-pay | Admitting: Internal Medicine

## 2022-01-03 DIAGNOSIS — E559 Vitamin D deficiency, unspecified: Secondary | ICD-10-CM

## 2022-01-04 ENCOUNTER — Encounter: Payer: Self-pay | Admitting: Internal Medicine

## 2022-01-04 ENCOUNTER — Telehealth (INDEPENDENT_AMBULATORY_CARE_PROVIDER_SITE_OTHER): Payer: BC Managed Care – PPO | Admitting: Internal Medicine

## 2022-01-04 VITALS — Wt 190.0 lb

## 2022-01-04 DIAGNOSIS — R200 Anesthesia of skin: Secondary | ICD-10-CM | POA: Diagnosis not present

## 2022-01-04 NOTE — Progress Notes (Signed)
Virtual Visit via Video Note  I connected with Dylan Kline on 01/04/22 at  9:00 AM EST by a video enabled telemedicine application and verified that I am speaking with the correct person using two identifiers.  Location patient: home Location provider: work office Persons participating in the virtual visit: patient, provider  I discussed the limitations of evaluation and management by telemedicine and the availability of in person appointments. The patient expressed understanding and agreed to proceed.   HPI: In November he was painting his deck and had a large gallon of paint dangling off his left index finger for a prolonged period of time.  Ever since then he has been having numbness to the lateral side of that finger.  He feels like the numbness is receding.  It used to be down to the PIP joint and now it is just beyond the DIP joint.  He would like to be seen by hand specialist.   ROS: Constitutional: Denies fever, chills, diaphoresis, appetite change and fatigue.  HEENT: Denies photophobia, eye pain, redness, hearing loss, ear pain, congestion, sore throat, rhinorrhea, sneezing, mouth sores, trouble swallowing, neck pain, neck stiffness and tinnitus.   Respiratory: Denies SOB, DOE, cough, chest tightness,  and wheezing.   Cardiovascular: Denies chest pain, palpitations and leg swelling.  Gastrointestinal: Denies nausea, vomiting, abdominal pain, diarrhea, constipation, blood in stool and abdominal distention.  Genitourinary: Denies dysuria, urgency, frequency, hematuria, flank pain and difficulty urinating.  Endocrine: Denies: hot or cold intolerance, sweats, changes in hair or nails, polyuria, polydipsia. Musculoskeletal: Denies myalgias, back pain, joint swelling, arthralgias and gait problem.  Skin: Denies pallor, rash and wound.  Neurological: Denies dizziness, seizures, syncope, weakness, light-headedness and headaches.  Hematological: Denies adenopathy. Easy bruising,  personal or family bleeding history  Psychiatric/Behavioral: Denies suicidal ideation, mood changes, confusion, nervousness, sleep disturbance and agitation   Past Medical History:  Diagnosis Date   Hyperlipidemia     Past Surgical History:  Procedure Laterality Date   CLEFT PALATE REPAIR     VASECTOMY     WISDOM TOOTH EXTRACTION      Family History  Problem Relation Age of Onset   Allergies Mother    Hyperlipidemia Mother    Heart disease Mother    Hyperlipidemia Father    Cancer Father        prostate   Colon cancer Neg Hx    Esophageal cancer Neg Hx    Rectal cancer Neg Hx    Stomach cancer Neg Hx     SOCIAL HX:   reports that he has never smoked. He has never used smokeless tobacco. He reports current alcohol use. He reports that he does not use drugs.   Current Outpatient Medications:    atorvastatin (LIPITOR) 40 MG tablet, TAKE 1 TABLET BY MOUTH EVERY DAY, Disp: 90 tablet, Rfl: 1  EXAM:   VITALS per patient if applicable: None reported  GENERAL: alert, oriented, appears well and in no acute distress  HEENT: atraumatic, conjunttiva clear, no obvious abnormalities on inspection of external nose and ears  NECK: normal movements of the head and neck  LUNGS: on inspection no signs of respiratory distress, breathing rate appears normal, no obvious gross increased work of breathing, gasping or wheezing  CV: no obvious cyanosis  MS: moves all visible extremities without noticeable abnormality  PSYCH/NEURO: pleasant and cooperative, no obvious depression or anxiety, speech and thought processing grossly intact  ASSESSMENT AND PLAN:   Numbness of finger - Plan: Ambulatory  referral to Orthopedic Surgery  -I believe treatment is still going to be observational, however I agree with referral to hand surgeon, will place referral.   I discussed the assessment and treatment plan with the patient. The patient was provided an opportunity to ask questions and all were  answered. The patient agreed with the plan and demonstrated an understanding of the instructions.   The patient was advised to call back or seek an in-person evaluation if the symptoms worsen or if the condition fails to improve as anticipated.    Lelon Frohlich, MD  [facility]

## 2022-01-10 DIAGNOSIS — M79645 Pain in left finger(s): Secondary | ICD-10-CM | POA: Diagnosis not present

## 2022-01-10 DIAGNOSIS — R200 Anesthesia of skin: Secondary | ICD-10-CM | POA: Diagnosis not present

## 2022-01-10 DIAGNOSIS — T148XXA Other injury of unspecified body region, initial encounter: Secondary | ICD-10-CM | POA: Diagnosis not present

## 2022-03-14 ENCOUNTER — Other Ambulatory Visit: Payer: Self-pay | Admitting: Internal Medicine

## 2022-03-14 DIAGNOSIS — E782 Mixed hyperlipidemia: Secondary | ICD-10-CM

## 2022-09-07 ENCOUNTER — Other Ambulatory Visit: Payer: Self-pay | Admitting: Internal Medicine

## 2022-09-07 DIAGNOSIS — E782 Mixed hyperlipidemia: Secondary | ICD-10-CM

## 2022-12-10 ENCOUNTER — Other Ambulatory Visit: Payer: Self-pay | Admitting: Internal Medicine

## 2022-12-10 DIAGNOSIS — E782 Mixed hyperlipidemia: Secondary | ICD-10-CM

## 2022-12-19 ENCOUNTER — Ambulatory Visit (INDEPENDENT_AMBULATORY_CARE_PROVIDER_SITE_OTHER): Payer: BC Managed Care – PPO | Admitting: Internal Medicine

## 2022-12-19 ENCOUNTER — Encounter: Payer: Self-pay | Admitting: Internal Medicine

## 2022-12-19 VITALS — BP 120/84 | HR 90 | Temp 97.5°F | Ht 71.5 in | Wt 192.0 lb

## 2022-12-19 DIAGNOSIS — E782 Mixed hyperlipidemia: Secondary | ICD-10-CM

## 2022-12-19 DIAGNOSIS — Z0000 Encounter for general adult medical examination without abnormal findings: Secondary | ICD-10-CM | POA: Diagnosis not present

## 2022-12-19 DIAGNOSIS — Z125 Encounter for screening for malignant neoplasm of prostate: Secondary | ICD-10-CM

## 2022-12-19 DIAGNOSIS — E559 Vitamin D deficiency, unspecified: Secondary | ICD-10-CM

## 2022-12-19 DIAGNOSIS — Z1159 Encounter for screening for other viral diseases: Secondary | ICD-10-CM

## 2022-12-19 DIAGNOSIS — Z8042 Family history of malignant neoplasm of prostate: Secondary | ICD-10-CM | POA: Diagnosis not present

## 2022-12-19 DIAGNOSIS — Z114 Encounter for screening for human immunodeficiency virus [HIV]: Secondary | ICD-10-CM

## 2022-12-19 LAB — COMPREHENSIVE METABOLIC PANEL
ALT: 26 U/L (ref 0–53)
AST: 16 U/L (ref 0–37)
Albumin: 4 g/dL (ref 3.5–5.2)
Alkaline Phosphatase: 52 U/L (ref 39–117)
BUN: 17 mg/dL (ref 6–23)
CO2: 27 meq/L (ref 19–32)
Calcium: 8.5 mg/dL (ref 8.4–10.5)
Chloride: 107 meq/L (ref 96–112)
Creatinine, Ser: 0.98 mg/dL (ref 0.40–1.50)
GFR: 88.16 mL/min (ref >60.00)
Glucose, Bld: 100 mg/dL — ABNORMAL HIGH (ref 70–99)
Potassium: 4.1 meq/L (ref 3.5–5.1)
Sodium: 140 meq/L (ref 135–145)
Total Bilirubin: 0.5 mg/dL (ref 0.2–1.2)
Total Protein: 6.6 g/dL (ref 6.0–8.3)

## 2022-12-19 LAB — CBC WITH DIFFERENTIAL/PLATELET
Basophils Absolute: 0 10*3/uL (ref 0.0–0.1)
Basophils Relative: 0.8 % (ref 0.0–3.0)
Eosinophils Absolute: 0.1 10*3/uL (ref 0.0–0.7)
Eosinophils Relative: 2.6 % (ref 0.0–5.0)
HCT: 45.7 % (ref 39.0–52.0)
Hemoglobin: 15.5 g/dL (ref 13.0–17.0)
Lymphocytes Relative: 40.1 % (ref 12.0–46.0)
Lymphs Abs: 2.3 10*3/uL (ref 0.7–4.0)
MCHC: 33.8 g/dL (ref 30.0–36.0)
MCV: 91 fL (ref 78.0–100.0)
Monocytes Absolute: 0.6 10*3/uL (ref 0.1–1.0)
Monocytes Relative: 10.5 % (ref 3.0–12.0)
Neutro Abs: 2.6 10*3/uL (ref 1.4–7.7)
Neutrophils Relative %: 46 % (ref 43.0–77.0)
Platelets: 176 10*3/uL (ref 150.0–400.0)
RBC: 5.03 Mil/uL (ref 4.22–5.81)
RDW: 13 % (ref 11.5–15.5)
WBC: 5.7 10*3/uL (ref 4.0–10.5)

## 2022-12-19 LAB — PSA: PSA: 1.56 ng/mL (ref 0.10–4.00)

## 2022-12-19 LAB — LIPID PANEL
Cholesterol: 152 mg/dL (ref 0–200)
HDL: 40.1 mg/dL (ref >39.00)
LDL Cholesterol: 93 mg/dL (ref 0–99)
NonHDL: 111.51
Total CHOL/HDL Ratio: 4
Triglycerides: 93 mg/dL (ref 0.0–149.0)
VLDL: 18.6 mg/dL (ref 0.0–40.0)

## 2022-12-19 LAB — TSH: TSH: 2.64 u[IU]/mL (ref 0.35–5.50)

## 2022-12-19 LAB — VITAMIN D 25 HYDROXY (VIT D DEFICIENCY, FRACTURES): VITD: 20.44 ng/mL — ABNORMAL LOW (ref 30.00–100.00)

## 2022-12-19 LAB — VITAMIN B12: Vitamin B-12: 261 pg/mL (ref 211–911)

## 2022-12-19 NOTE — Progress Notes (Signed)
Established Patient Office Visit     CC/Reason for Visit: Annual preventive exam  HPI: Dylan Kline is a 52 y.o. male who is coming in today for the above mentioned reasons. Past Medical History is significant for: Hyperlipidemia on atorvastatin 40 mg that he takes about 50% of the time, vitamin D deficiency.  Otherwise is feeling well without acute concerns or complaints.  All immunizations are up-to-date.  He had his colonoscopy in 2023 and is a 5-year callback.   Past Medical/Surgical History: Past Medical History:  Diagnosis Date   Hyperlipidemia     Past Surgical History:  Procedure Laterality Date   CLEFT PALATE REPAIR     VASECTOMY     WISDOM TOOTH EXTRACTION      Social History:  reports that he has never smoked. He has never used smokeless tobacco. He reports current alcohol use. He reports that he does not use drugs.  Allergies: No Known Allergies  Family History:  Family History  Problem Relation Age of Onset   Allergies Mother    Hyperlipidemia Mother    Heart disease Mother    Hyperlipidemia Father    Cancer Father        prostate   Prostate cancer Brother    Colon cancer Neg Hx    Esophageal cancer Neg Hx    Rectal cancer Neg Hx    Stomach cancer Neg Hx      Current Outpatient Medications:    atorvastatin (LIPITOR) 40 MG tablet, TAKE 1 TABLET BY MOUTH EVERY DAY, Disp: 90 tablet, Rfl: 0  Review of Systems:  Negative unless indicated in HPI.   Physical Exam: Vitals:   12/19/22 0800  BP: 120/84  Pulse: 90  Temp: (!) 97.5 F (36.4 C)  TempSrc: Oral  SpO2: 98%  Weight: 192 lb (87.1 kg)  Height: 5' 11.5" (1.816 m)    Body mass index is 26.41 kg/m.   Physical Exam Vitals reviewed.  Constitutional:      General: He is not in acute distress.    Appearance: Normal appearance. He is not ill-appearing, toxic-appearing or diaphoretic.  HENT:     Head: Normocephalic.     Right Ear: Tympanic membrane, ear canal and external ear  normal. There is no impacted cerumen.     Left Ear: Tympanic membrane, ear canal and external ear normal. There is no impacted cerumen.     Nose: Nose normal.     Mouth/Throat:     Mouth: Mucous membranes are moist.     Pharynx: Oropharynx is clear. No oropharyngeal exudate or posterior oropharyngeal erythema.  Eyes:     General: No scleral icterus.       Right eye: No discharge.        Left eye: No discharge.     Conjunctiva/sclera: Conjunctivae normal.     Pupils: Pupils are equal, round, and reactive to light.  Neck:     Vascular: No carotid bruit.  Cardiovascular:     Rate and Rhythm: Normal rate and regular rhythm.     Pulses: Normal pulses.     Heart sounds: Normal heart sounds.  Pulmonary:     Effort: Pulmonary effort is normal. No respiratory distress.     Breath sounds: Normal breath sounds.  Abdominal:     General: Abdomen is flat. Bowel sounds are normal.     Palpations: Abdomen is soft.  Musculoskeletal:        General: Normal range of motion.  Cervical back: Normal range of motion.  Skin:    General: Skin is warm and dry.  Neurological:     General: No focal deficit present.     Mental Status: He is alert and oriented to person, place, and time. Mental status is at baseline.  Psychiatric:        Mood and Affect: Mood normal.        Behavior: Behavior normal.        Thought Content: Thought content normal.        Judgment: Judgment normal.     Flowsheet Row Office Visit from 12/19/2022 in [facility] [place]  PHQ-9 Total Score 0        Impression and Plan:  Vitamin D deficiency -     VITAMIN D 25 Hydroxy (Vit-D Deficiency, Fractures); Future  Encounter for preventive health examination  Mixed hyperlipidemia -     CBC with Differential/Platelet; Future -     Comprehensive metabolic panel; Future -     Lipid panel; Future -     TSH; Future -     Vitamin B12; Future  Family history of prostate cancer  Encounter for  hepatitis C screening test for low risk patient -     Hepatitis C antibody; Future  Encounter for screening for HIV -     HIV Antibody (routine testing w rflx); Future  Prostate cancer screening -     PSA; Future   -Recommend routine eye and dental care. -Healthy lifestyle discussed in detail. -Labs to be updated today. -Prostate cancer screening: PSA today Health Maintenance  Topic Date Due   HIV Screening  Never done   Hepatitis C Screening  Never done   Colon Cancer Screening  11/14/2026   DTaP/Tdap/Td vaccine (3 - Td or Tdap) 12/04/2026   Flu Shot  Completed   COVID-19 Vaccine  Completed   Zoster (Shingles) Vaccine  Completed   HPV Vaccine  Aged Out    -All immunizations are up-to-date.     Chaya Jan, MD [facility]

## 2022-12-20 LAB — HEPATITIS C ANTIBODY: Hepatitis C Ab: NONREACTIVE

## 2022-12-20 LAB — HIV ANTIBODY (ROUTINE TESTING W REFLEX): HIV 1&2 Ab, 4th Generation: NONREACTIVE

## 2022-12-24 ENCOUNTER — Other Ambulatory Visit: Payer: Self-pay | Admitting: Internal Medicine

## 2022-12-24 DIAGNOSIS — E559 Vitamin D deficiency, unspecified: Secondary | ICD-10-CM

## 2022-12-24 MED ORDER — VITAMIN D (ERGOCALCIFEROL) 1.25 MG (50000 UNIT) PO CAPS: 50000.0000 [IU] | ORAL_CAPSULE | ORAL | 0 refills | Status: DC

## 2023-01-07 DIAGNOSIS — H18593 Other hereditary corneal dystrophies, bilateral: Secondary | ICD-10-CM | POA: Diagnosis not present

## 2023-03-16 ENCOUNTER — Other Ambulatory Visit: Payer: Self-pay | Admitting: Internal Medicine

## 2023-03-16 DIAGNOSIS — E782 Mixed hyperlipidemia: Secondary | ICD-10-CM

## 2023-03-23 ENCOUNTER — Other Ambulatory Visit: Payer: Self-pay | Admitting: Internal Medicine

## 2023-03-23 DIAGNOSIS — E559 Vitamin D deficiency, unspecified: Secondary | ICD-10-CM

## 2023-09-29 ENCOUNTER — Other Ambulatory Visit: Payer: Self-pay | Admitting: Internal Medicine

## 2023-09-29 DIAGNOSIS — E782 Mixed hyperlipidemia: Secondary | ICD-10-CM
